# Patient Record
Sex: Female | Born: 1950 | Race: White | Hispanic: No | Marital: Married
Health system: Southern US, Community
[De-identification: ages and names within clinical notes are randomized; demographics above are authoritative.]

---

## 2005-09-01 ENCOUNTER — Ambulatory Visit: Payer: Self-pay | Admitting: Internal Medicine

## 2008-05-08 ENCOUNTER — Ambulatory Visit: Payer: Self-pay | Admitting: Internal Medicine

## 2008-09-14 ENCOUNTER — Ambulatory Visit: Payer: Self-pay | Admitting: General Practice

## 2008-12-15 ENCOUNTER — Ambulatory Visit: Payer: Self-pay

## 2009-08-22 ENCOUNTER — Ambulatory Visit: Payer: Self-pay | Admitting: Internal Medicine

## 2009-09-06 ENCOUNTER — Ambulatory Visit: Payer: Self-pay | Admitting: Gastroenterology

## 2010-06-15 ENCOUNTER — Ambulatory Visit: Payer: Self-pay | Admitting: Internal Medicine

## 2010-11-25 ENCOUNTER — Ambulatory Visit: Payer: Self-pay | Admitting: Internal Medicine

## 2011-11-26 ENCOUNTER — Ambulatory Visit: Payer: Self-pay | Admitting: Internal Medicine

## 2012-04-26 ENCOUNTER — Emergency Department: Payer: Self-pay | Admitting: Internal Medicine

## 2012-04-26 LAB — URINALYSIS, COMPLETE
Bilirubin,UR: NEGATIVE
Glucose,UR: NEGATIVE mg/dL (ref 0–75)
Ketone: NEGATIVE
Ph: 7 (ref 4.5–8.0)
Protein: NEGATIVE
RBC,UR: 2 /HPF (ref 0–5)

## 2012-04-26 LAB — COMPREHENSIVE METABOLIC PANEL
Albumin: 4.2 g/dL (ref 3.4–5.0)
Anion Gap: 7 (ref 7–16)
Bilirubin,Total: 0.4 mg/dL (ref 0.2–1.0)
Chloride: 107 mmol/L (ref 98–107)
Co2: 27 mmol/L (ref 21–32)
Creatinine: 0.78 mg/dL (ref 0.60–1.30)
EGFR (African American): >60
EGFR (Non-African Amer.): >60
Osmolality: 281 (ref 275–301)
Potassium: 3.3 mmol/L — ABNORMAL LOW (ref 3.5–5.1)
SGOT(AST): 29 U/L (ref 15–37)
Sodium: 141 mmol/L (ref 136–145)
Total Protein: 8.6 g/dL — ABNORMAL HIGH (ref 6.4–8.2)

## 2012-04-26 LAB — CBC
HCT: 45.8 % (ref 35.0–47.0)
HGB: 15.3 g/dL (ref 12.0–16.0)
Platelet: 237 10*3/uL (ref 150–440)
RBC: 4.76 10*6/uL (ref 3.80–5.20)
RDW: 13.2 % (ref 11.5–14.5)

## 2012-05-18 ENCOUNTER — Emergency Department: Payer: Self-pay | Admitting: Emergency Medicine

## 2012-05-18 LAB — COMPREHENSIVE METABOLIC PANEL
Alkaline Phosphatase: 65 U/L (ref 50–136)
Anion Gap: 9 (ref 7–16)
BUN: 12 mg/dL (ref 7–18)
Co2: 25 mmol/L (ref 21–32)
Creatinine: 0.68 mg/dL (ref 0.60–1.30)
EGFR (African American): >60
EGFR (Non-African Amer.): >60
Potassium: 3.5 mmol/L (ref 3.5–5.1)
SGOT(AST): 48 U/L — ABNORMAL HIGH (ref 15–37)
SGPT (ALT): 35 U/L (ref 12–78)

## 2012-05-18 LAB — CBC
HCT: 40.1 % (ref 35.0–47.0)
HGB: 13.4 g/dL (ref 12.0–16.0)
MCH: 31.8 pg (ref 26.0–34.0)
MCHC: 33.5 g/dL (ref 32.0–36.0)
MCV: 95 fL (ref 80–100)
Platelet: 294 10*3/uL (ref 150–440)
RDW: 13.1 % (ref 11.5–14.5)

## 2012-05-18 LAB — PROTIME-INR
INR: 0.9
Prothrombin Time: 12.7 secs (ref 11.5–14.7)

## 2012-05-18 LAB — APTT: Activated PTT: 30.9 secs (ref 23.6–35.9)

## 2012-05-20 ENCOUNTER — Ambulatory Visit: Payer: Self-pay | Admitting: Orthopedic Surgery

## 2012-05-23 ENCOUNTER — Ambulatory Visit: Payer: Self-pay | Admitting: General Practice

## 2012-12-05 ENCOUNTER — Ambulatory Visit: Payer: Self-pay | Admitting: Internal Medicine

## 2012-12-06 ENCOUNTER — Ambulatory Visit: Payer: Self-pay | Admitting: Internal Medicine

## 2012-12-17 ENCOUNTER — Inpatient Hospital Stay: Payer: Self-pay | Admitting: Specialist

## 2012-12-17 LAB — BASIC METABOLIC PANEL
Anion Gap: 3 — ABNORMAL LOW (ref 7–16)
BUN: 17 mg/dL (ref 7–18)
Calcium, Total: 9.2 mg/dL (ref 8.5–10.1)
Chloride: 107 mmol/L (ref 98–107)
Co2: 28 mmol/L (ref 21–32)
EGFR (African American): >60
Glucose: 97 mg/dL (ref 65–99)
Potassium: 3.8 mmol/L (ref 3.5–5.1)
Sodium: 138 mmol/L (ref 136–145)

## 2012-12-17 LAB — CBC
HCT: 46.4 % (ref 35.0–47.0)
HGB: 15.9 g/dL (ref 12.0–16.0)
MCH: 32.6 pg (ref 26.0–34.0)
MCHC: 34.4 g/dL (ref 32.0–36.0)
MCV: 95 fL (ref 80–100)
RBC: 4.89 10*6/uL (ref 3.80–5.20)
RDW: 12.5 % (ref 11.5–14.5)
WBC: 12.4 10*3/uL — ABNORMAL HIGH (ref 3.6–11.0)

## 2012-12-17 LAB — PROTIME-INR: INR: 0.9

## 2012-12-17 LAB — HEMOGLOBIN: HGB: 14.2 g/dL (ref 12.0–16.0)

## 2012-12-29 ENCOUNTER — Ambulatory Visit: Payer: Self-pay | Admitting: Internal Medicine

## 2013-01-24 ENCOUNTER — Ambulatory Visit: Payer: Self-pay | Admitting: Specialist

## 2013-02-06 ENCOUNTER — Inpatient Hospital Stay: Payer: Self-pay | Admitting: Internal Medicine

## 2013-02-06 LAB — COMPREHENSIVE METABOLIC PANEL
Albumin: 2.5 g/dL — ABNORMAL LOW (ref 3.4–5.0)
Alkaline Phosphatase: 94 U/L (ref 50–136)
Anion Gap: 9 (ref 7–16)
BUN: 7 mg/dL (ref 7–18)
Bilirubin,Total: 0.4 mg/dL (ref 0.2–1.0)
Calcium, Total: 9.6 mg/dL (ref 8.5–10.1)
Co2: 25 mmol/L (ref 21–32)
Creatinine: 0.67 mg/dL (ref 0.60–1.30)
EGFR (African American): >60
Glucose: 67 mg/dL (ref 65–99)
Potassium: 3.6 mmol/L (ref 3.5–5.1)
SGOT(AST): 16 U/L (ref 15–37)
SGPT (ALT): 22 U/L (ref 12–78)
Sodium: 137 mmol/L (ref 136–145)

## 2013-02-06 LAB — CBC WITH DIFFERENTIAL/PLATELET
Basophil %: 1 %
Eosinophil #: 0.1 10*3/uL (ref 0.0–0.7)
Eosinophil %: 1.2 %
HCT: 39.8 % (ref 35.0–47.0)
HGB: 13.5 g/dL (ref 12.0–16.0)
Lymphocyte #: 2.1 10*3/uL (ref 1.0–3.6)
Lymphocyte %: 21.5 %
MCHC: 34 g/dL (ref 32.0–36.0)
Neutrophil #: 6.4 10*3/uL (ref 1.4–6.5)
Platelet: 385 10*3/uL (ref 150–440)

## 2013-02-06 LAB — URINALYSIS, COMPLETE
Bacteria: NONE SEEN
Bilirubin,UR: NEGATIVE
Glucose,UR: NEGATIVE mg/dL (ref 0–75)
Nitrite: NEGATIVE
Protein: NEGATIVE

## 2013-02-06 LAB — AMYLASE: Amylase: 46 U/L (ref 25–115)

## 2013-02-06 LAB — LIPASE, BLOOD: Lipase: 195 U/L (ref 73–393)

## 2013-02-07 LAB — COMPREHENSIVE METABOLIC PANEL
Albumin: 2.4 g/dL — ABNORMAL LOW (ref 3.4–5.0)
Alkaline Phosphatase: 90 U/L (ref 50–136)
Anion Gap: 10 (ref 7–16)
Chloride: 103 mmol/L (ref 98–107)
Creatinine: 0.63 mg/dL (ref 0.60–1.30)
EGFR (Non-African Amer.): >60
Osmolality: 270 (ref 275–301)
SGPT (ALT): 20 U/L (ref 12–78)
Sodium: 137 mmol/L (ref 136–145)
Total Protein: 7.1 g/dL (ref 6.4–8.2)

## 2013-02-07 LAB — CBC WITH DIFFERENTIAL/PLATELET
Eosinophil #: 0.1 10*3/uL (ref 0.0–0.7)
Eosinophil %: 0.9 %
HCT: 34.6 % — ABNORMAL LOW (ref 35.0–47.0)
HGB: 12.1 g/dL (ref 12.0–16.0)
Lymphocyte %: 14.3 %
MCH: 31.9 pg (ref 26.0–34.0)
MCHC: 34.9 g/dL (ref 32.0–36.0)
Monocyte #: 1 x10 3/mm — ABNORMAL HIGH (ref 0.2–0.9)
Neutrophil #: 6.7 10*3/uL — ABNORMAL HIGH (ref 1.4–6.5)
Neutrophil %: 72.9 %
RBC: 3.78 10*6/uL — ABNORMAL LOW (ref 3.80–5.20)
RDW: 12.5 % (ref 11.5–14.5)
WBC: 9.2 10*3/uL (ref 3.6–11.0)

## 2013-02-11 LAB — COMPREHENSIVE METABOLIC PANEL
Albumin: 2.3 g/dL — ABNORMAL LOW (ref 3.4–5.0)
Alkaline Phosphatase: 65 U/L (ref 50–136)
Anion Gap: 5 — ABNORMAL LOW (ref 7–16)
BUN: <1 mg/dL — ABNORMAL LOW (ref 7–18)
Bilirubin,Total: 0.3 mg/dL (ref 0.2–1.0)
Calcium, Total: 9 mg/dL (ref 8.5–10.1)
Chloride: 105 mmol/L (ref 98–107)
Co2: 28 mmol/L (ref 21–32)
Creatinine: 0.6 mg/dL (ref 0.60–1.30)
EGFR (African American): >60
Sodium: 138 mmol/L (ref 136–145)
Total Protein: 6.4 g/dL (ref 6.4–8.2)

## 2013-02-11 LAB — CBC WITH DIFFERENTIAL/PLATELET
Basophil #: 0.1 10*3/uL (ref 0.0–0.1)
Eosinophil #: 0.3 10*3/uL (ref 0.0–0.7)
Eosinophil %: 3.2 %
HGB: 11.7 g/dL — ABNORMAL LOW (ref 12.0–16.0)
Lymphocyte #: 1.7 10*3/uL (ref 1.0–3.6)
MCH: 31.6 pg (ref 26.0–34.0)
MCHC: 34.5 g/dL (ref 32.0–36.0)
Monocyte #: 1.1 x10 3/mm — ABNORMAL HIGH (ref 0.2–0.9)
Monocyte %: 12 %
Neutrophil #: 5.7 10*3/uL (ref 1.4–6.5)
Neutrophil %: 65 %

## 2013-02-12 LAB — BASIC METABOLIC PANEL
Anion Gap: 7 (ref 7–16)
Co2: 28 mmol/L (ref 21–32)
Creatinine: 0.62 mg/dL (ref 0.60–1.30)
Glucose: 88 mg/dL (ref 65–99)

## 2013-02-13 LAB — COMPREHENSIVE METABOLIC PANEL
Alkaline Phosphatase: 61 U/L (ref 50–136)
Calcium, Total: 8.4 mg/dL — ABNORMAL LOW (ref 8.5–10.1)
Creatinine: 0.62 mg/dL (ref 0.60–1.30)
EGFR (African American): >60
Glucose: 77 mg/dL (ref 65–99)
Osmolality: 278 (ref 275–301)
SGPT (ALT): 9 U/L — ABNORMAL LOW (ref 12–78)
Sodium: 142 mmol/L (ref 136–145)

## 2013-02-13 LAB — CBC WITH DIFFERENTIAL/PLATELET
Basophil #: 0.1 10*3/uL (ref 0.0–0.1)
Basophil %: 1 %
HCT: 32.8 % — ABNORMAL LOW (ref 35.0–47.0)
Lymphocyte %: 21 %
MCH: 31.6 pg (ref 26.0–34.0)
MCV: 93 fL (ref 80–100)
Monocyte #: 1.1 x10 3/mm — ABNORMAL HIGH (ref 0.2–0.9)
Neutrophil #: 6.3 10*3/uL (ref 1.4–6.5)
Neutrophil %: 64.7 %
Platelet: 347 10*3/uL (ref 150–440)
RBC: 3.54 10*6/uL — ABNORMAL LOW (ref 3.80–5.20)
WBC: 9.8 10*3/uL (ref 3.6–11.0)

## 2013-02-22 ENCOUNTER — Emergency Department: Payer: Self-pay | Admitting: Emergency Medicine

## 2013-02-22 LAB — CBC
HGB: 13.8 g/dL (ref 12.0–16.0)
MCH: 31.1 pg (ref 26.0–34.0)
Platelet: 448 10*3/uL — ABNORMAL HIGH (ref 150–440)
RDW: 14.3 % (ref 11.5–14.5)
WBC: 11.9 10*3/uL — ABNORMAL HIGH (ref 3.6–11.0)

## 2013-02-22 LAB — COMPREHENSIVE METABOLIC PANEL
Alkaline Phosphatase: 83 U/L (ref 50–136)
BUN: 5 mg/dL — ABNORMAL LOW (ref 7–18)
Bilirubin,Total: 0.7 mg/dL (ref 0.2–1.0)
Calcium, Total: 9.4 mg/dL (ref 8.5–10.1)
Chloride: 98 mmol/L (ref 98–107)
Co2: 24 mmol/L (ref 21–32)
Creatinine: 0.5 mg/dL — ABNORMAL LOW (ref 0.60–1.30)
Glucose: 110 mg/dL — ABNORMAL HIGH (ref 65–99)
Sodium: 131 mmol/L — ABNORMAL LOW (ref 136–145)
Total Protein: 7.3 g/dL (ref 6.4–8.2)

## 2013-03-03 ENCOUNTER — Inpatient Hospital Stay: Payer: Self-pay | Admitting: Surgery

## 2013-03-03 LAB — CBC WITH DIFFERENTIAL/PLATELET
Basophil %: 1 %
HGB: 13 g/dL (ref 12.0–16.0)
Lymphocyte #: 1.8 10*3/uL (ref 1.0–3.6)
MCH: 30.8 pg (ref 26.0–34.0)
Monocyte #: 1.2 x10 3/mm — ABNORMAL HIGH (ref 0.2–0.9)
Neutrophil %: 72.1 %
Platelet: 372 10*3/uL (ref 150–440)
WBC: 11.3 10*3/uL — ABNORMAL HIGH (ref 3.6–11.0)

## 2013-03-03 LAB — URINALYSIS, COMPLETE
Blood: NEGATIVE
Glucose,UR: NEGATIVE mg/dL (ref 0–75)
Nitrite: NEGATIVE
Ph: 7 (ref 4.5–8.0)
Protein: NEGATIVE
RBC,UR: 1 /HPF (ref 0–5)
Squamous Epithelial: 5
WBC UR: 3 /HPF (ref 0–5)

## 2013-03-03 LAB — COMPREHENSIVE METABOLIC PANEL
Anion Gap: 9 (ref 7–16)
BUN: 5 mg/dL — ABNORMAL LOW (ref 7–18)
Bilirubin,Total: 0.4 mg/dL (ref 0.2–1.0)
Co2: 29 mmol/L (ref 21–32)
Creatinine: 0.58 mg/dL — ABNORMAL LOW (ref 0.60–1.30)
EGFR (African American): >60
EGFR (Non-African Amer.): >60
Glucose: 70 mg/dL (ref 65–99)
Osmolality: 271 (ref 275–301)
Potassium: 3.9 mmol/L (ref 3.5–5.1)
Sodium: 138 mmol/L (ref 136–145)

## 2013-03-03 LAB — CLOSTRIDIUM DIFFICILE BY PCR

## 2013-03-05 LAB — CBC WITH DIFFERENTIAL/PLATELET
Basophil #: 0.1 10*3/uL (ref 0.0–0.1)
Basophil %: 0.5 %
Eosinophil #: 0.1 10*3/uL (ref 0.0–0.7)
Eosinophil %: 1 %
HGB: 12.3 g/dL (ref 12.0–16.0)
Lymphocyte #: 1.8 10*3/uL (ref 1.0–3.6)
Lymphocyte %: 15 %
MCHC: 33.4 g/dL (ref 32.0–36.0)
MCV: 93 fL (ref 80–100)
Neutrophil #: 8.6 10*3/uL — ABNORMAL HIGH (ref 1.4–6.5)
Platelet: 338 10*3/uL (ref 150–440)
WBC: 12 10*3/uL — ABNORMAL HIGH (ref 3.6–11.0)

## 2013-03-05 LAB — COMPREHENSIVE METABOLIC PANEL
Anion Gap: 4 — ABNORMAL LOW (ref 7–16)
BUN: 2 mg/dL — ABNORMAL LOW (ref 7–18)
Bilirubin,Total: 0.4 mg/dL (ref 0.2–1.0)
Calcium, Total: 9 mg/dL (ref 8.5–10.1)
Chloride: 103 mmol/L (ref 98–107)
Creatinine: 0.77 mg/dL (ref 0.60–1.30)
Glucose: 116 mg/dL — ABNORMAL HIGH (ref 65–99)
Potassium: 3.1 mmol/L — ABNORMAL LOW (ref 3.5–5.1)
SGPT (ALT): 15 U/L (ref 12–78)
Sodium: 138 mmol/L (ref 136–145)

## 2013-03-06 LAB — CBC WITH DIFFERENTIAL/PLATELET
Lymphocyte %: 20.2 %
MCH: 31.5 pg (ref 26.0–34.0)
MCHC: 34 g/dL (ref 32.0–36.0)
MCV: 93 fL (ref 80–100)
Monocyte #: 1.2 x10 3/mm — ABNORMAL HIGH (ref 0.2–0.9)
Neutrophil #: 6.5 10*3/uL (ref 1.4–6.5)
Neutrophil %: 65.6 %
Platelet: 328 10*3/uL (ref 150–440)
RBC: 3.76 10*6/uL — ABNORMAL LOW (ref 3.80–5.20)
WBC: 10 10*3/uL (ref 3.6–11.0)

## 2013-03-06 LAB — BASIC METABOLIC PANEL
Anion Gap: 4 — ABNORMAL LOW (ref 7–16)
BUN: 1 mg/dL — ABNORMAL LOW (ref 7–18)
Calcium, Total: 9 mg/dL (ref 8.5–10.1)
Co2: 28 mmol/L (ref 21–32)
EGFR (African American): >60
EGFR (Non-African Amer.): >60
Potassium: 3.6 mmol/L (ref 3.5–5.1)

## 2013-03-07 LAB — CBC WITH DIFFERENTIAL/PLATELET
Basophil #: 0 10*3/uL (ref 0.0–0.1)
HGB: 12 g/dL (ref 12.0–16.0)
Lymphocyte #: 2 10*3/uL (ref 1.0–3.6)
Lymphocyte %: 26.6 %
MCH: 31.1 pg (ref 26.0–34.0)
MCHC: 33.6 g/dL (ref 32.0–36.0)
MCV: 92 fL (ref 80–100)
Monocyte #: 0.8 x10 3/mm (ref 0.2–0.9)
Monocyte %: 11.2 %
Neutrophil #: 4.2 10*3/uL (ref 1.4–6.5)
Platelet: 333 10*3/uL (ref 150–440)
RBC: 3.85 10*6/uL (ref 3.80–5.20)

## 2013-03-07 LAB — COMPREHENSIVE METABOLIC PANEL
Alkaline Phosphatase: 59 U/L (ref 50–136)
Bilirubin,Total: 0.4 mg/dL (ref 0.2–1.0)
Co2: 31 mmol/L (ref 21–32)
Creatinine: 0.69 mg/dL (ref 0.60–1.30)
EGFR (African American): >60
EGFR (Non-African Amer.): >60
Glucose: 92 mg/dL (ref 65–99)
Osmolality: 279 (ref 275–301)
SGOT(AST): 26 U/L (ref 15–37)
SGPT (ALT): 15 U/L (ref 12–78)

## 2013-03-09 LAB — BASIC METABOLIC PANEL
Calcium, Total: 8.7 mg/dL (ref 8.5–10.1)
Creatinine: 0.6 mg/dL (ref 0.60–1.30)
EGFR (African American): >60
EGFR (Non-African Amer.): >60
Glucose: 90 mg/dL (ref 65–99)
Osmolality: 277 (ref 275–301)
Potassium: 3.4 mmol/L — ABNORMAL LOW (ref 3.5–5.1)

## 2013-03-10 LAB — MAGNESIUM: Magnesium: 1.5 mg/dL — ABNORMAL LOW

## 2013-03-13 LAB — CBC WITH DIFFERENTIAL/PLATELET
Basophil #: 0.1 10*3/uL (ref 0.0–0.1)
Eosinophil %: 2.2 %
HCT: 31 % — ABNORMAL LOW (ref 35.0–47.0)
MCH: 31 pg (ref 26.0–34.0)
MCHC: 33.6 g/dL (ref 32.0–36.0)
MCV: 92 fL (ref 80–100)
Neutrophil #: 5.6 10*3/uL (ref 1.4–6.5)
Platelet: 277 10*3/uL (ref 150–440)
RBC: 3.36 10*6/uL — ABNORMAL LOW (ref 3.80–5.20)
RDW: 14.8 % — ABNORMAL HIGH (ref 11.5–14.5)
WBC: 9.8 10*3/uL (ref 3.6–11.0)

## 2013-03-13 LAB — COMPREHENSIVE METABOLIC PANEL
Albumin: 1.7 g/dL — ABNORMAL LOW (ref 3.4–5.0)
Anion Gap: 3 — ABNORMAL LOW (ref 7–16)
Bilirubin,Total: 0.3 mg/dL (ref 0.2–1.0)
Calcium, Total: 8.3 mg/dL — ABNORMAL LOW (ref 8.5–10.1)
Chloride: 107 mmol/L (ref 98–107)
Co2: 30 mmol/L (ref 21–32)
EGFR (African American): >60
EGFR (Non-African Amer.): >60
Glucose: 228 mg/dL — ABNORMAL HIGH (ref 65–99)
Osmolality: 285 (ref 275–301)
Potassium: 3.1 mmol/L — ABNORMAL LOW (ref 3.5–5.1)
SGOT(AST): 18 U/L (ref 15–37)
Sodium: 140 mmol/L (ref 136–145)
Total Protein: 6.2 g/dL — ABNORMAL LOW (ref 6.4–8.2)

## 2013-03-14 LAB — CBC WITH DIFFERENTIAL/PLATELET
Basophil #: 0.1 10*3/uL (ref 0.0–0.1)
Basophil %: 1.1 %
Eosinophil %: 4.5 %
Lymphocyte %: 22.6 %
MCHC: 33.9 g/dL (ref 32.0–36.0)
MCV: 92 fL (ref 80–100)
RBC: 3.48 10*6/uL — ABNORMAL LOW (ref 3.80–5.20)
WBC: 8.4 10*3/uL (ref 3.6–11.0)

## 2013-03-14 LAB — COMPREHENSIVE METABOLIC PANEL
Albumin: 1.7 g/dL — ABNORMAL LOW (ref 3.4–5.0)
Alkaline Phosphatase: 71 U/L (ref 50–136)
Anion Gap: 5 — ABNORMAL LOW (ref 7–16)
Bilirubin,Total: 0.3 mg/dL (ref 0.2–1.0)
Calcium, Total: 8.5 mg/dL (ref 8.5–10.1)
Chloride: 106 mmol/L (ref 98–107)
Co2: 28 mmol/L (ref 21–32)
Creatinine: 0.6 mg/dL (ref 0.60–1.30)
EGFR (African American): >60
EGFR (Non-African Amer.): >60
Osmolality: 285 (ref 275–301)
Potassium: 3.5 mmol/L (ref 3.5–5.1)

## 2013-03-15 LAB — CBC WITH DIFFERENTIAL/PLATELET
Basophil %: 0.5 %
Eosinophil %: 0.6 %
HGB: 10.7 g/dL — ABNORMAL LOW (ref 12.0–16.0)
Lymphocyte #: 2 10*3/uL (ref 1.0–3.6)
Lymphocyte %: 13.2 %
MCH: 30.3 pg (ref 26.0–34.0)
MCHC: 32.6 g/dL (ref 32.0–36.0)
MCV: 93 fL (ref 80–100)
Monocyte %: 10.1 %
Neutrophil %: 75.6 %
Platelet: 312 10*3/uL (ref 150–440)
RBC: 3.53 10*6/uL — ABNORMAL LOW (ref 3.80–5.20)
RDW: 14.6 % — ABNORMAL HIGH (ref 11.5–14.5)
WBC: 15.3 10*3/uL — ABNORMAL HIGH (ref 3.6–11.0)

## 2013-03-15 LAB — BASIC METABOLIC PANEL
BUN: 11 mg/dL (ref 7–18)
Chloride: 103 mmol/L (ref 98–107)
Co2: 30 mmol/L (ref 21–32)
Creatinine: 0.6 mg/dL (ref 0.60–1.30)
EGFR (Non-African Amer.): >60

## 2013-03-16 LAB — COMPREHENSIVE METABOLIC PANEL
Albumin: 1.7 g/dL — ABNORMAL LOW (ref 3.4–5.0)
BUN: 8 mg/dL (ref 7–18)
Bilirubin,Total: 0.4 mg/dL (ref 0.2–1.0)
Calcium, Total: 8.8 mg/dL (ref 8.5–10.1)
Chloride: 97 mmol/L — ABNORMAL LOW (ref 98–107)
Creatinine: 0.68 mg/dL (ref 0.60–1.30)
EGFR (Non-African Amer.): >60
Glucose: 250 mg/dL — ABNORMAL HIGH (ref 65–99)
SGOT(AST): 13 U/L — ABNORMAL LOW (ref 15–37)
SGPT (ALT): 15 U/L (ref 12–78)
Total Protein: 7.3 g/dL (ref 6.4–8.2)

## 2013-03-16 LAB — CBC WITH DIFFERENTIAL/PLATELET
Basophil #: 0.2 10*3/uL — ABNORMAL HIGH (ref 0.0–0.1)
Basophil %: 0.8 %
Eosinophil #: 0.2 10*3/uL (ref 0.0–0.7)
HCT: 34.1 % — ABNORMAL LOW (ref 35.0–47.0)
HGB: 11 g/dL — ABNORMAL LOW (ref 12.0–16.0)
Monocyte #: 2.1 x10 3/mm — ABNORMAL HIGH (ref 0.2–0.9)
Monocyte %: 10 %
RDW: 14.9 % — ABNORMAL HIGH (ref 11.5–14.5)
WBC: 20.9 10*3/uL — ABNORMAL HIGH (ref 3.6–11.0)

## 2013-03-16 LAB — PHOSPHORUS: Phosphorus: 2.1 mg/dL — ABNORMAL LOW (ref 2.5–4.9)

## 2013-03-17 LAB — CBC WITH DIFFERENTIAL/PLATELET
Eosinophil #: 0.3 10*3/uL (ref 0.0–0.7)
Eosinophil %: 2.1 %
HGB: 9.8 g/dL — ABNORMAL LOW (ref 12.0–16.0)
Lymphocyte %: 9.1 %
MCH: 30.3 pg (ref 26.0–34.0)
MCHC: 32.7 g/dL (ref 32.0–36.0)
MCV: 93 fL (ref 80–100)
Monocyte #: 1.6 x10 3/mm — ABNORMAL HIGH (ref 0.2–0.9)
Platelet: 340 10*3/uL (ref 150–440)
RBC: 3.22 10*6/uL — ABNORMAL LOW (ref 3.80–5.20)
RDW: 15.1 % — ABNORMAL HIGH (ref 11.5–14.5)

## 2013-03-17 LAB — COMPREHENSIVE METABOLIC PANEL
Albumin: 1.4 g/dL — ABNORMAL LOW (ref 3.4–5.0)
BUN: 10 mg/dL (ref 7–18)
Calcium, Total: 8 mg/dL — ABNORMAL LOW (ref 8.5–10.1)
Co2: 32 mmol/L (ref 21–32)
Creatinine: 0.53 mg/dL — ABNORMAL LOW (ref 0.60–1.30)
EGFR (African American): >60
EGFR (Non-African Amer.): >60
Glucose: 230 mg/dL — ABNORMAL HIGH (ref 65–99)
SGOT(AST): 17 U/L (ref 15–37)
SGPT (ALT): 11 U/L — ABNORMAL LOW (ref 12–78)
Total Protein: 5.9 g/dL — ABNORMAL LOW (ref 6.4–8.2)

## 2013-03-17 LAB — PHOSPHORUS: Phosphorus: 2.5 mg/dL (ref 2.5–4.9)

## 2013-03-17 LAB — MAGNESIUM: Magnesium: 1.8 mg/dL

## 2013-03-18 LAB — SODIUM: Sodium: 136 mmol/L (ref 136–145)

## 2013-03-18 LAB — CBC WITH DIFFERENTIAL/PLATELET
Eosinophil #: 0.4 10*3/uL (ref 0.0–0.7)
Eosinophil %: 2.9 %
Lymphocyte #: 1.9 10*3/uL (ref 1.0–3.6)
Lymphocyte %: 12.9 %
MCHC: 32.8 g/dL (ref 32.0–36.0)
Monocyte #: 1.5 x10 3/mm — ABNORMAL HIGH (ref 0.2–0.9)
Monocyte %: 10 %
Neutrophil #: 10.9 10*3/uL — ABNORMAL HIGH (ref 1.4–6.5)
Platelet: 390 10*3/uL (ref 150–440)
RDW: 15.2 % — ABNORMAL HIGH (ref 11.5–14.5)
WBC: 14.9 10*3/uL — ABNORMAL HIGH (ref 3.6–11.0)

## 2013-03-18 LAB — PHOSPHORUS: Phosphorus: 2.3 mg/dL — ABNORMAL LOW (ref 2.5–4.9)

## 2013-03-18 LAB — FOLATE: Folic Acid: 3.8 ng/mL (ref 3.1–100.0)

## 2013-03-18 LAB — POTASSIUM: Potassium: 3.5 mmol/L (ref 3.5–5.1)

## 2013-03-18 LAB — MAGNESIUM: Magnesium: 1.6 mg/dL — ABNORMAL LOW

## 2013-03-19 LAB — COMPREHENSIVE METABOLIC PANEL
Albumin: 1.8 g/dL — ABNORMAL LOW (ref 3.4–5.0)
Alkaline Phosphatase: 107 U/L (ref 50–136)
Anion Gap: 3 — ABNORMAL LOW (ref 7–16)
BUN: 7 mg/dL (ref 7–18)
Calcium, Total: 8.8 mg/dL (ref 8.5–10.1)
Creatinine: 0.58 mg/dL — ABNORMAL LOW (ref 0.60–1.30)
EGFR (Non-African Amer.): >60
Glucose: 116 mg/dL — ABNORMAL HIGH (ref 65–99)
Osmolality: 275 (ref 275–301)
Potassium: 3.6 mmol/L (ref 3.5–5.1)
SGOT(AST): 33 U/L (ref 15–37)
SGPT (ALT): 20 U/L (ref 12–78)
Total Protein: 6.5 g/dL (ref 6.4–8.2)

## 2013-03-19 LAB — CBC WITH DIFFERENTIAL/PLATELET
Basophil %: 0.9 %
Eosinophil #: 0.6 10*3/uL (ref 0.0–0.7)
HCT: 29.6 % — ABNORMAL LOW (ref 35.0–47.0)
HGB: 9.7 g/dL — ABNORMAL LOW (ref 12.0–16.0)
MCH: 30.6 pg (ref 26.0–34.0)
MCV: 93 fL (ref 80–100)
Monocyte #: 1.5 x10 3/mm — ABNORMAL HIGH (ref 0.2–0.9)
Platelet: 383 10*3/uL (ref 150–440)
RBC: 3.18 10*6/uL — ABNORMAL LOW (ref 3.80–5.20)
RDW: 15.8 % — ABNORMAL HIGH (ref 11.5–14.5)

## 2013-03-19 LAB — PHOSPHORUS: Phosphorus: 3.5 mg/dL (ref 2.5–4.9)

## 2013-03-19 LAB — MAGNESIUM: Magnesium: 1.6 mg/dL — ABNORMAL LOW

## 2013-03-20 LAB — SODIUM: Sodium: 138 mmol/L (ref 136–145)

## 2013-03-20 LAB — CALCIUM: Calcium, Total: 8.4 mg/dL — ABNORMAL LOW (ref 8.5–10.1)

## 2013-03-20 LAB — POTASSIUM: Potassium: 3.8 mmol/L (ref 3.5–5.1)

## 2013-03-20 LAB — MAGNESIUM: Magnesium: 2 mg/dL

## 2013-03-20 LAB — CLOSTRIDIUM DIFFICILE(ARMC)

## 2013-03-21 LAB — CBC WITH DIFFERENTIAL/PLATELET
Basophil %: 0.6 %
Eosinophil %: 3.3 %
HCT: 32.3 % — ABNORMAL LOW (ref 35.0–47.0)
HGB: 10.5 g/dL — ABNORMAL LOW (ref 12.0–16.0)
Lymphocyte #: 1.2 10*3/uL (ref 1.0–3.6)
Lymphocyte %: 9.1 %
MCH: 30.6 pg (ref 26.0–34.0)
MCHC: 32.4 g/dL (ref 32.0–36.0)
MCV: 94 fL (ref 80–100)
Monocyte #: 1 x10 3/mm — ABNORMAL HIGH (ref 0.2–0.9)
Monocyte %: 7.8 %
Neutrophil #: 10.1 10*3/uL — ABNORMAL HIGH (ref 1.4–6.5)
Platelet: 388 10*3/uL (ref 150–440)
RDW: 16.5 % — ABNORMAL HIGH (ref 11.5–14.5)
WBC: 12.8 10*3/uL — ABNORMAL HIGH (ref 3.6–11.0)

## 2013-03-21 LAB — CALCIUM: Calcium, Total: 7.9 mg/dL — ABNORMAL LOW (ref 8.5–10.1)

## 2013-03-21 LAB — MAGNESIUM: Magnesium: 1.7 mg/dL — ABNORMAL LOW

## 2013-03-21 LAB — PHOSPHORUS: Phosphorus: 3.7 mg/dL (ref 2.5–4.9)

## 2013-03-22 LAB — CBC WITH DIFFERENTIAL/PLATELET
Basophil #: 0 10*3/uL (ref 0.0–0.1)
Basophil %: 0.3 %
Eosinophil #: 0.4 10*3/uL (ref 0.0–0.7)
HGB: 10.2 g/dL — ABNORMAL LOW (ref 12.0–16.0)
Lymphocyte #: 2.2 10*3/uL (ref 1.0–3.6)
MCH: 30.3 pg (ref 26.0–34.0)
MCHC: 32.3 g/dL (ref 32.0–36.0)
Monocyte #: 1.3 x10 3/mm — ABNORMAL HIGH (ref 0.2–0.9)
Neutrophil %: 68.5 %
RBC: 3.36 10*6/uL — ABNORMAL LOW (ref 3.80–5.20)
WBC: 12.3 10*3/uL — ABNORMAL HIGH (ref 3.6–11.0)

## 2013-03-22 LAB — PHOSPHORUS: Phosphorus: 2.4 mg/dL — ABNORMAL LOW (ref 2.5–4.9)

## 2013-03-22 LAB — SODIUM: Sodium: 138 mmol/L (ref 136–145)

## 2013-03-23 LAB — PHOSPHORUS: Phosphorus: 3.2 mg/dL (ref 2.5–4.9)

## 2013-03-23 LAB — SODIUM: Sodium: 138 mmol/L (ref 136–145)

## 2013-03-24 LAB — MAGNESIUM: Magnesium: 1.6 mg/dL — ABNORMAL LOW

## 2013-03-24 LAB — CBC WITH DIFFERENTIAL/PLATELET
Basophil #: 0.1 10*3/uL (ref 0.0–0.1)
Basophil %: 1.3 %
Eosinophil #: 0.4 10*3/uL (ref 0.0–0.7)
Eosinophil %: 3.9 %
HCT: 32 % — ABNORMAL LOW (ref 35.0–47.0)
HGB: 10.4 g/dL — ABNORMAL LOW (ref 12.0–16.0)
Lymphocyte #: 2.2 10*3/uL (ref 1.0–3.6)
Lymphocyte %: 19.3 %
MCV: 94 fL (ref 80–100)
Monocyte %: 12.6 %
Neutrophil #: 7 10*3/uL — ABNORMAL HIGH (ref 1.4–6.5)
Neutrophil %: 62.9 %
Platelet: 423 10*3/uL (ref 150–440)

## 2013-03-24 LAB — POTASSIUM: Potassium: 3.8 mmol/L (ref 3.5–5.1)

## 2013-03-24 LAB — PROTIME-INR: Prothrombin Time: 13.6 secs (ref 11.5–14.7)

## 2013-03-24 LAB — PHOSPHORUS: Phosphorus: 3 mg/dL (ref 2.5–4.9)

## 2013-03-24 LAB — SODIUM: Sodium: 137 mmol/L (ref 136–145)

## 2013-03-24 LAB — CALCIUM: Calcium, Total: 8.6 mg/dL (ref 8.5–10.1)

## 2013-03-25 LAB — BASIC METABOLIC PANEL
BUN: 11 mg/dL (ref 7–18)
Calcium, Total: 8.3 mg/dL — ABNORMAL LOW (ref 8.5–10.1)
Chloride: 108 mmol/L — ABNORMAL HIGH (ref 98–107)
Creatinine: 0.58 mg/dL — ABNORMAL LOW (ref 0.60–1.30)
EGFR (African American): >60
EGFR (Non-African Amer.): >60
Glucose: 119 mg/dL — ABNORMAL HIGH (ref 65–99)
Osmolality: 272 (ref 275–301)
Potassium: 4.2 mmol/L (ref 3.5–5.1)
Sodium: 136 mmol/L (ref 136–145)

## 2013-03-26 LAB — BASIC METABOLIC PANEL
Anion Gap: 3 — ABNORMAL LOW (ref 7–16)
Anion Gap: 9 (ref 7–16)
BUN: 10 mg/dL (ref 7–18)
BUN: 11 mg/dL (ref 7–18)
Calcium, Total: 8.1 mg/dL — ABNORMAL LOW (ref 8.5–10.1)
Co2: 25 mmol/L (ref 21–32)
Co2: 25 mmol/L (ref 21–32)
Creatinine: 0.63 mg/dL (ref 0.60–1.30)
EGFR (African American): >60
EGFR (Non-African Amer.): >60
Glucose: 125 mg/dL — ABNORMAL HIGH (ref 65–99)
Osmolality: 298 (ref 275–301)
Potassium: 4 mmol/L (ref 3.5–5.1)
Sodium: 139 mmol/L (ref 136–145)

## 2013-03-26 LAB — MAGNESIUM: Magnesium: 2 mg/dL

## 2013-03-27 LAB — BASIC METABOLIC PANEL
Anion Gap: 8 (ref 7–16)
BUN: 11 mg/dL (ref 7–18)
Calcium, Total: 8.3 mg/dL — ABNORMAL LOW (ref 8.5–10.1)
Creatinine: 0.45 mg/dL — ABNORMAL LOW (ref 0.60–1.30)
EGFR (African American): >60
Glucose: 112 mg/dL — ABNORMAL HIGH (ref 65–99)
Osmolality: 272 (ref 275–301)
Sodium: 136 mmol/L (ref 136–145)

## 2013-03-28 LAB — PLATELET COUNT: Platelet: 407 10*3/uL (ref 150–440)

## 2013-03-28 LAB — ALBUMIN: Albumin: 1.7 g/dL — ABNORMAL LOW (ref 3.4–5.0)

## 2013-03-28 LAB — CALCIUM: Calcium, Total: 7.9 mg/dL — ABNORMAL LOW (ref 8.5–10.1)

## 2013-03-28 LAB — POTASSIUM: Potassium: 3.9 mmol/L (ref 3.5–5.1)

## 2013-03-29 LAB — COMPREHENSIVE METABOLIC PANEL
BUN: 9 mg/dL (ref 7–18)
Bilirubin,Total: 0.4 mg/dL (ref 0.2–1.0)
Calcium, Total: 8 mg/dL — ABNORMAL LOW (ref 8.5–10.1)
Chloride: 100 mmol/L (ref 98–107)
Co2: 29 mmol/L (ref 21–32)
Creatinine: 0.53 mg/dL — ABNORMAL LOW (ref 0.60–1.30)
EGFR (Non-African Amer.): >60
Glucose: 91 mg/dL (ref 65–99)
Potassium: 3.8 mmol/L (ref 3.5–5.1)
SGOT(AST): 10 U/L — ABNORMAL LOW (ref 15–37)
Sodium: 134 mmol/L — ABNORMAL LOW (ref 136–145)
Total Protein: 5.4 g/dL — ABNORMAL LOW (ref 6.4–8.2)

## 2013-03-29 LAB — CBC WITH DIFFERENTIAL/PLATELET
Bands: 2 %
HCT: 30.1 % — ABNORMAL LOW (ref 35.0–47.0)
Lymphocytes: 21 %
MCH: 30.4 pg (ref 26.0–34.0)
MCHC: 32.5 g/dL (ref 32.0–36.0)
MCV: 94 fL (ref 80–100)
Monocytes: 12 %
Platelet: 377 10*3/uL (ref 150–440)
RBC: 3.22 10*6/uL — ABNORMAL LOW (ref 3.80–5.20)
Variant Lymphocyte - H1-Rlymph: 3 %
WBC: 14.2 10*3/uL — ABNORMAL HIGH (ref 3.6–11.0)

## 2013-03-30 LAB — CBC WITH DIFFERENTIAL/PLATELET
Basophil #: 0.1 10*3/uL (ref 0.0–0.1)
HCT: 29.2 % — ABNORMAL LOW (ref 35.0–47.0)
Lymphocyte %: 20.9 %
MCHC: 33.2 g/dL (ref 32.0–36.0)
Monocyte #: 1.7 x10 3/mm — ABNORMAL HIGH (ref 0.2–0.9)
Monocyte %: 16.4 %
Neutrophil %: 55.9 %
RDW: 16.7 % — ABNORMAL HIGH (ref 11.5–14.5)
WBC: 10.5 10*3/uL (ref 3.6–11.0)

## 2013-03-30 LAB — SODIUM: Sodium: 137 mmol/L (ref 136–145)

## 2013-03-30 LAB — POTASSIUM: Potassium: 3.7 mmol/L (ref 3.5–5.1)

## 2013-03-30 LAB — PHOSPHORUS: Phosphorus: 3.5 mg/dL (ref 2.5–4.9)

## 2013-03-30 LAB — CALCIUM: Calcium, Total: 8.5 mg/dL (ref 8.5–10.1)

## 2013-03-31 LAB — BASIC METABOLIC PANEL
BUN: 8 mg/dL (ref 7–18)
Calcium, Total: 8.5 mg/dL (ref 8.5–10.1)
Chloride: 105 mmol/L (ref 98–107)
Co2: 29 mmol/L (ref 21–32)
Creatinine: 0.55 mg/dL — ABNORMAL LOW (ref 0.60–1.30)
EGFR (African American): >60
Osmolality: 275 (ref 275–301)
Potassium: 3.7 mmol/L (ref 3.5–5.1)

## 2013-03-31 LAB — PHOSPHORUS: Phosphorus: 3.5 mg/dL (ref 2.5–4.9)

## 2013-03-31 LAB — MAGNESIUM: Magnesium: 1.5 mg/dL — ABNORMAL LOW

## 2013-04-01 LAB — BASIC METABOLIC PANEL
Anion Gap: 4 — ABNORMAL LOW (ref 7–16)
BUN: 9 mg/dL (ref 7–18)
Calcium, Total: 8.6 mg/dL (ref 8.5–10.1)
Chloride: 104 mmol/L (ref 98–107)
Creatinine: 0.59 mg/dL — ABNORMAL LOW (ref 0.60–1.30)
EGFR (African American): >60
Osmolality: 274 (ref 275–301)
Sodium: 137 mmol/L (ref 136–145)

## 2013-04-01 LAB — MAGNESIUM: Magnesium: 1.8 mg/dL

## 2013-04-01 LAB — PHOSPHORUS: Phosphorus: 3.7 mg/dL (ref 2.5–4.9)

## 2013-04-02 LAB — POTASSIUM: Potassium: 3.6 mmol/L (ref 3.5–5.1)

## 2013-04-03 LAB — SODIUM: Sodium: 138 mmol/L (ref 136–145)

## 2013-04-03 LAB — CALCIUM: Calcium, Total: 8.8 mg/dL (ref 8.5–10.1)

## 2013-04-03 LAB — MAGNESIUM: Magnesium: 1.7 mg/dL — ABNORMAL LOW

## 2013-04-04 LAB — COMPREHENSIVE METABOLIC PANEL
Albumin: 1.9 g/dL — ABNORMAL LOW (ref 3.4–5.0)
BUN: 8 mg/dL (ref 7–18)
Bilirubin,Total: 0.2 mg/dL (ref 0.2–1.0)
Co2: 27 mmol/L (ref 21–32)
Creatinine: 0.52 mg/dL — ABNORMAL LOW (ref 0.60–1.30)
EGFR (African American): >60
EGFR (Non-African Amer.): >60
Glucose: 148 mg/dL — ABNORMAL HIGH (ref 65–99)
Potassium: 3.6 mmol/L (ref 3.5–5.1)
SGOT(AST): 12 U/L — ABNORMAL LOW (ref 15–37)
Total Protein: 6.2 g/dL — ABNORMAL LOW (ref 6.4–8.2)

## 2013-04-04 LAB — CBC WITH DIFFERENTIAL/PLATELET
Basophil %: 1 %
Eosinophil #: 0.2 10*3/uL (ref 0.0–0.7)
Eosinophil %: 1.8 %
HCT: 30.1 % — ABNORMAL LOW (ref 35.0–47.0)
HGB: 9.9 g/dL — ABNORMAL LOW (ref 12.0–16.0)
Lymphocyte %: 13.4 %
MCH: 30.6 pg (ref 26.0–34.0)
MCHC: 32.9 g/dL (ref 32.0–36.0)
MCV: 93 fL (ref 80–100)
Monocyte #: 1.1 x10 3/mm — ABNORMAL HIGH (ref 0.2–0.9)
Monocyte %: 8.8 %
Neutrophil #: 9.5 10*3/uL — ABNORMAL HIGH (ref 1.4–6.5)
Platelet: 343 10*3/uL (ref 150–440)
WBC: 12.7 10*3/uL — ABNORMAL HIGH (ref 3.6–11.0)

## 2013-04-05 LAB — SODIUM: Sodium: 138 mmol/L (ref 136–145)

## 2013-04-05 LAB — MAGNESIUM: Magnesium: 1.9 mg/dL

## 2013-04-05 LAB — POTASSIUM: Potassium: 3.7 mmol/L (ref 3.5–5.1)

## 2013-04-06 LAB — CBC WITH DIFFERENTIAL/PLATELET
Basophil #: 0.1 10*3/uL (ref 0.0–0.1)
Eosinophil #: 0.3 10*3/uL (ref 0.0–0.7)
Eosinophil %: 2.8 %
HCT: 31.5 % — ABNORMAL LOW (ref 35.0–47.0)
HGB: 10.3 g/dL — ABNORMAL LOW (ref 12.0–16.0)
MCH: 30.5 pg (ref 26.0–34.0)
MCV: 93 fL (ref 80–100)
Monocyte #: 1.2 x10 3/mm — ABNORMAL HIGH (ref 0.2–0.9)
Monocyte %: 12.5 %
Neutrophil #: 6.2 10*3/uL (ref 1.4–6.5)
Neutrophil %: 66.2 %
Platelet: 397 10*3/uL (ref 150–440)
RBC: 3.39 10*6/uL — ABNORMAL LOW (ref 3.80–5.20)
RDW: 16.7 % — ABNORMAL HIGH (ref 11.5–14.5)
WBC: 9.3 10*3/uL (ref 3.6–11.0)

## 2013-04-06 LAB — CALCIUM: Calcium, Total: 8.9 mg/dL (ref 8.5–10.1)

## 2013-04-06 LAB — SODIUM: Sodium: 138 mmol/L (ref 136–145)

## 2013-04-06 LAB — ALBUMIN: Albumin: 2 g/dL — ABNORMAL LOW (ref 3.4–5.0)

## 2013-04-07 LAB — CALCIUM: Calcium, Total: 8.8 mg/dL (ref 8.5–10.1)

## 2013-04-07 LAB — CREATININE, SERUM
Creatinine: 0.56 mg/dL — ABNORMAL LOW (ref 0.60–1.30)
EGFR (Non-African Amer.): >60

## 2013-04-07 LAB — POTASSIUM: Potassium: 3.6 mmol/L (ref 3.5–5.1)

## 2013-04-07 LAB — ALBUMIN: Albumin: 2 g/dL — ABNORMAL LOW (ref 3.4–5.0)

## 2013-04-07 LAB — PHOSPHORUS: Phosphorus: 3.4 mg/dL (ref 2.5–4.9)

## 2013-04-07 LAB — MAGNESIUM: Magnesium: 2.2 mg/dL

## 2013-04-08 LAB — POTASSIUM: Potassium: 3.8 mmol/L (ref 3.5–5.1)

## 2013-04-08 LAB — SODIUM: Sodium: 137 mmol/L (ref 136–145)

## 2013-04-08 LAB — CALCIUM: Calcium, Total: 8.8 mg/dL (ref 8.5–10.1)

## 2013-04-08 LAB — ALBUMIN: Albumin: 2.1 g/dL — ABNORMAL LOW (ref 3.4–5.0)

## 2013-04-09 LAB — SODIUM: Sodium: 140 mmol/L (ref 136–145)

## 2013-04-09 LAB — CALCIUM: Calcium, Total: 9 mg/dL (ref 8.5–10.1)

## 2013-04-09 LAB — POTASSIUM: Potassium: 3.5 mmol/L (ref 3.5–5.1)

## 2013-04-10 LAB — COMPREHENSIVE METABOLIC PANEL
Alkaline Phosphatase: 78 U/L
BUN: 11 mg/dL (ref 7–18)
Bilirubin,Total: 0.2 mg/dL (ref 0.2–1.0)
Calcium, Total: 8.9 mg/dL (ref 8.5–10.1)
EGFR (African American): >60
Osmolality: 279 (ref 275–301)
SGPT (ALT): 23 U/L (ref 12–78)
Total Protein: 6.6 g/dL (ref 6.4–8.2)

## 2013-04-10 LAB — CBC WITH DIFFERENTIAL/PLATELET
Basophil %: 0.9 %
Eosinophil #: 0.3 10*3/uL (ref 0.0–0.7)
Eosinophil %: 3.7 %
HGB: 10.2 g/dL — ABNORMAL LOW (ref 12.0–16.0)
Lymphocyte #: 2.1 10*3/uL (ref 1.0–3.6)
Lymphocyte %: 24.5 %
Monocyte #: 1.1 x10 3/mm — ABNORMAL HIGH (ref 0.2–0.9)
Monocyte %: 13 %
Neutrophil #: 5.1 10*3/uL (ref 1.4–6.5)
Neutrophil %: 57.9 %
RBC: 3.28 10*6/uL — ABNORMAL LOW (ref 3.80–5.20)
RDW: 16.8 % — ABNORMAL HIGH (ref 11.5–14.5)

## 2013-04-11 LAB — POTASSIUM: Potassium: 3.8 mmol/L (ref 3.5–5.1)

## 2013-04-12 LAB — CBC WITH DIFFERENTIAL/PLATELET
Basophil #: 0.1 10*3/uL (ref 0.0–0.1)
Eosinophil #: 0 10*3/uL (ref 0.0–0.7)
Eosinophil %: 0.1 %
Lymphocyte #: 1.1 10*3/uL (ref 1.0–3.6)
MCHC: 32.6 g/dL (ref 32.0–36.0)
Monocyte #: 1.2 x10 3/mm — ABNORMAL HIGH (ref 0.2–0.9)
Monocyte %: 7.2 %
Neutrophil #: 14.5 10*3/uL — ABNORMAL HIGH (ref 1.4–6.5)
Neutrophil %: 85.9 %

## 2013-04-13 LAB — SODIUM: Sodium: 134 mmol/L — ABNORMAL LOW (ref 136–145)

## 2013-04-13 LAB — POTASSIUM: Potassium: 3.9 mmol/L (ref 3.5–5.1)

## 2013-04-13 LAB — PHOSPHORUS: Phosphorus: 2.7 mg/dL (ref 2.5–4.9)

## 2013-04-13 LAB — CALCIUM: Calcium, Total: 8.8 mg/dL (ref 8.5–10.1)

## 2013-04-14 LAB — CBC WITH DIFFERENTIAL/PLATELET
Basophil #: 0.1 10*3/uL (ref 0.0–0.1)
Eosinophil #: 0.6 10*3/uL (ref 0.0–0.7)
Eosinophil %: 4.2 %
MCH: 29.1 pg (ref 26.0–34.0)
MCHC: 31.5 g/dL — ABNORMAL LOW (ref 32.0–36.0)
Monocyte #: 1.9 x10 3/mm — ABNORMAL HIGH (ref 0.2–0.9)
Monocyte %: 13.1 %
Neutrophil #: 9 10*3/uL — ABNORMAL HIGH (ref 1.4–6.5)
Neutrophil %: 62 %
Platelet: 427 10*3/uL (ref 150–440)
RDW: 16.4 % — ABNORMAL HIGH (ref 11.5–14.5)
WBC: 14.5 10*3/uL — ABNORMAL HIGH (ref 3.6–11.0)

## 2013-04-14 LAB — WOUND CULTURE

## 2013-04-14 LAB — CALCIUM: Calcium, Total: 8.6 mg/dL (ref 8.5–10.1)

## 2013-04-14 LAB — POTASSIUM: Potassium: 4.2 mmol/L (ref 3.5–5.1)

## 2013-04-14 LAB — PHOSPHORUS: Phosphorus: 2.9 mg/dL (ref 2.5–4.9)

## 2013-04-15 LAB — SODIUM: Sodium: 136 mmol/L (ref 136–145)

## 2013-04-15 LAB — CALCIUM: Calcium, Total: 8.4 mg/dL — ABNORMAL LOW (ref 8.5–10.1)

## 2013-04-15 LAB — MAGNESIUM: Magnesium: 1.9 mg/dL

## 2013-04-15 LAB — PHOSPHORUS: Phosphorus: 3.8 mg/dL (ref 2.5–4.9)

## 2013-04-16 LAB — CALCIUM: Calcium, Total: 8.7 mg/dL (ref 8.5–10.1)

## 2013-04-17 LAB — PHOSPHORUS: Phosphorus: 3.4 mg/dL (ref 2.5–4.9)

## 2013-04-17 LAB — MAGNESIUM: Magnesium: 2 mg/dL

## 2013-04-18 LAB — CBC WITH DIFFERENTIAL/PLATELET
Basophil #: 0.1 10*3/uL (ref 0.0–0.1)
Eosinophil #: 0.4 10*3/uL (ref 0.0–0.7)
Eosinophil %: 3.7 %
HCT: 29.4 % — ABNORMAL LOW (ref 35.0–47.0)
HGB: 9.6 g/dL — ABNORMAL LOW (ref 12.0–16.0)
Lymphocyte #: 2.4 10*3/uL (ref 1.0–3.6)
Lymphocyte %: 24.3 %
MCH: 29.6 pg (ref 26.0–34.0)
MCHC: 32.6 g/dL (ref 32.0–36.0)
Monocyte %: 14.5 %
Neutrophil #: 5.6 10*3/uL (ref 1.4–6.5)
RBC: 3.23 10*6/uL — ABNORMAL LOW (ref 3.80–5.20)
WBC: 9.9 10*3/uL (ref 3.6–11.0)

## 2013-04-18 LAB — MAGNESIUM: Magnesium: 2.2 mg/dL

## 2013-04-18 LAB — ALBUMIN: Albumin: 1.9 g/dL — ABNORMAL LOW (ref 3.4–5.0)

## 2013-04-18 LAB — CALCIUM: Calcium, Total: 9 mg/dL (ref 8.5–10.1)

## 2013-04-18 LAB — POTASSIUM: Potassium: 3.9 mmol/L (ref 3.5–5.1)

## 2013-04-20 LAB — CBC WITH DIFFERENTIAL/PLATELET
Eosinophil %: 3.6 %
HCT: 29.8 % — ABNORMAL LOW (ref 35.0–47.0)
HGB: 9.4 g/dL — ABNORMAL LOW (ref 12.0–16.0)
Lymphocyte #: 2.3 10*3/uL (ref 1.0–3.6)
Monocyte #: 1.5 x10 3/mm — ABNORMAL HIGH (ref 0.2–0.9)
Monocyte %: 14.9 %
Neutrophil %: 57.5 %
RBC: 3.28 10*6/uL — ABNORMAL LOW (ref 3.80–5.20)
RDW: 16.3 % — ABNORMAL HIGH (ref 11.5–14.5)
WBC: 10.2 10*3/uL (ref 3.6–11.0)

## 2013-04-20 LAB — BASIC METABOLIC PANEL
Anion Gap: 4 — ABNORMAL LOW (ref 7–16)
BUN: 9 mg/dL (ref 7–18)
Chloride: 105 mmol/L (ref 98–107)
EGFR (Non-African Amer.): >60
Glucose: 90 mg/dL (ref 65–99)
Osmolality: 276 (ref 275–301)
Sodium: 139 mmol/L (ref 136–145)

## 2013-04-21 LAB — MAGNESIUM: Magnesium: 2.1 mg/dL

## 2013-04-21 LAB — ALBUMIN: Albumin: 2 g/dL — ABNORMAL LOW (ref 3.4–5.0)

## 2013-04-21 LAB — CALCIUM: Calcium, Total: 9.1 mg/dL (ref 8.5–10.1)

## 2013-04-22 LAB — MAGNESIUM: Magnesium: 2.1 mg/dL

## 2013-04-22 LAB — ALBUMIN: Albumin: 2.2 g/dL — ABNORMAL LOW (ref 3.4–5.0)

## 2013-04-22 LAB — PHOSPHORUS: Phosphorus: 4.1 mg/dL (ref 2.5–4.9)

## 2013-04-22 LAB — CALCIUM: Calcium, Total: 9.2 mg/dL (ref 8.5–10.1)

## 2013-04-22 LAB — SODIUM: Sodium: 137 mmol/L (ref 136–145)

## 2013-04-23 LAB — MAGNESIUM: Magnesium: 1.6 mg/dL — ABNORMAL LOW

## 2013-04-23 LAB — BASIC METABOLIC PANEL
Calcium, Total: 9.4 mg/dL (ref 8.5–10.1)
Chloride: 105 mmol/L (ref 98–107)
Co2: 28 mmol/L (ref 21–32)
EGFR (African American): >60
EGFR (Non-African Amer.): >60
Potassium: 3.7 mmol/L (ref 3.5–5.1)
Sodium: 137 mmol/L (ref 136–145)

## 2013-04-23 LAB — PHOSPHORUS: Phosphorus: 4.2 mg/dL (ref 2.5–4.9)

## 2013-04-26 ENCOUNTER — Emergency Department: Payer: Self-pay | Admitting: Emergency Medicine

## 2013-04-26 LAB — COMPREHENSIVE METABOLIC PANEL
Albumin: 2.9 g/dL — ABNORMAL LOW (ref 3.4–5.0)
Alkaline Phosphatase: 92 U/L
BUN: 10 mg/dL (ref 7–18)
Bilirubin,Total: 0.7 mg/dL (ref 0.2–1.0)
Chloride: 102 mmol/L (ref 98–107)
EGFR (Non-African Amer.): >60
Glucose: 117 mg/dL — ABNORMAL HIGH (ref 65–99)
Osmolality: 268 (ref 275–301)
Potassium: 3.3 mmol/L — ABNORMAL LOW (ref 3.5–5.1)
SGOT(AST): 29 U/L (ref 15–37)
SGPT (ALT): 31 U/L (ref 12–78)
Sodium: 134 mmol/L — ABNORMAL LOW (ref 136–145)
Total Protein: 8.3 g/dL — ABNORMAL HIGH (ref 6.4–8.2)

## 2013-04-26 LAB — CBC WITH DIFFERENTIAL/PLATELET
Basophil #: 0.1 10*3/uL (ref 0.0–0.1)
Eosinophil #: 0.1 10*3/uL (ref 0.0–0.7)
Eosinophil %: 0.4 %
Lymphocyte #: 2 10*3/uL (ref 1.0–3.6)
Lymphocyte %: 14.5 %
MCHC: 32.9 g/dL (ref 32.0–36.0)
MCV: 89 fL (ref 80–100)
Monocyte #: 1.4 x10 3/mm — ABNORMAL HIGH (ref 0.2–0.9)
Neutrophil #: 10.1 10*3/uL — ABNORMAL HIGH (ref 1.4–6.5)
Neutrophil %: 74.1 %
Platelet: 528 10*3/uL — ABNORMAL HIGH (ref 150–440)
RBC: 4.19 10*6/uL (ref 3.80–5.20)
RDW: 16.6 % — ABNORMAL HIGH (ref 11.5–14.5)
WBC: 13.7 10*3/uL — ABNORMAL HIGH (ref 3.6–11.0)

## 2013-06-19 ENCOUNTER — Ambulatory Visit: Payer: Self-pay | Admitting: Internal Medicine

## 2013-07-07 ENCOUNTER — Inpatient Hospital Stay: Payer: Self-pay | Admitting: Internal Medicine

## 2013-07-07 LAB — CBC WITH DIFFERENTIAL/PLATELET
HCT: 45.7 % (ref 35.0–47.0)
HGB: 15.2 g/dL (ref 12.0–16.0)
Lymphocytes: 23 %
MCH: 29.6 pg (ref 26.0–34.0)
MCHC: 33.3 g/dL (ref 32.0–36.0)
MCV: 89 fL (ref 80–100)
Monocytes: 8 %
PLATELETS: 421 10*3/uL (ref 150–440)
RBC: 5.15 10*6/uL (ref 3.80–5.20)
RDW: 17.6 % — ABNORMAL HIGH (ref 11.5–14.5)
SEGMENTED NEUTROPHILS: 69 %
WBC: 18.3 10*3/uL — AB (ref 3.6–11.0)

## 2013-07-07 LAB — CLOSTRIDIUM DIFFICILE(ARMC)

## 2013-07-07 LAB — COMPREHENSIVE METABOLIC PANEL
ALK PHOS: 100 U/L
ALT: 12 U/L (ref 12–78)
ANION GAP: 10 (ref 7–16)
AST: 20 U/L (ref 15–37)
Albumin: 2.5 g/dL — ABNORMAL LOW (ref 3.4–5.0)
BUN: 7 mg/dL (ref 7–18)
Bilirubin,Total: 0.7 mg/dL (ref 0.2–1.0)
CALCIUM: 9.4 mg/dL (ref 8.5–10.1)
CHLORIDE: 99 mmol/L (ref 98–107)
CO2: 25 mmol/L (ref 21–32)
CREATININE: 0.61 mg/dL (ref 0.60–1.30)
EGFR (Non-African Amer.): >60
Glucose: 80 mg/dL (ref 65–99)
Osmolality: 265 (ref 275–301)
POTASSIUM: 3.5 mmol/L (ref 3.5–5.1)
Sodium: 134 mmol/L — ABNORMAL LOW (ref 136–145)
TOTAL PROTEIN: 8.3 g/dL — AB (ref 6.4–8.2)

## 2013-07-07 LAB — URINALYSIS, COMPLETE
Bacteria: NONE SEEN
Bilirubin,UR: NEGATIVE
GLUCOSE, UR: NEGATIVE mg/dL (ref 0–75)
NITRITE: NEGATIVE
PH: 6 (ref 4.5–8.0)
PROTEIN: NEGATIVE
RBC,UR: 1 /HPF (ref 0–5)
SPECIFIC GRAVITY: 1.005 (ref 1.003–1.030)
WBC UR: 84 /HPF (ref 0–5)

## 2013-07-07 LAB — LIPASE, BLOOD: LIPASE: 120 U/L (ref 73–393)

## 2013-07-08 LAB — CBC WITH DIFFERENTIAL/PLATELET
Basophil #: 0.1 10*3/uL (ref 0.0–0.1)
Basophil %: 0.7 %
Eosinophil #: 0.1 10*3/uL (ref 0.0–0.7)
Eosinophil %: 1.1 %
HCT: 34.2 % — ABNORMAL LOW (ref 35.0–47.0)
HGB: 11.2 g/dL — AB (ref 12.0–16.0)
LYMPHS PCT: 22.8 %
Lymphocyte #: 2.4 10*3/uL (ref 1.0–3.6)
MCH: 28.9 pg (ref 26.0–34.0)
MCHC: 32.7 g/dL (ref 32.0–36.0)
MCV: 89 fL (ref 80–100)
MONO ABS: 1.1 x10 3/mm — AB (ref 0.2–0.9)
Monocyte %: 10.6 %
NEUTROS PCT: 64.8 %
Neutrophil #: 6.9 10*3/uL — ABNORMAL HIGH (ref 1.4–6.5)
PLATELETS: 369 10*3/uL (ref 150–440)
RBC: 3.87 10*6/uL (ref 3.80–5.20)
RDW: 17.3 % — AB (ref 11.5–14.5)
WBC: 10.6 10*3/uL (ref 3.6–11.0)

## 2013-07-08 LAB — MAGNESIUM: Magnesium: 1.7 mg/dL — ABNORMAL LOW

## 2013-07-08 LAB — COMPREHENSIVE METABOLIC PANEL
ALBUMIN: 1.9 g/dL — AB (ref 3.4–5.0)
ALK PHOS: 71 U/L
Anion Gap: 9 (ref 7–16)
BUN: 4 mg/dL — ABNORMAL LOW (ref 7–18)
Bilirubin,Total: 0.6 mg/dL (ref 0.2–1.0)
CHLORIDE: 104 mmol/L (ref 98–107)
CO2: 26 mmol/L (ref 21–32)
Calcium, Total: 8.3 mg/dL — ABNORMAL LOW (ref 8.5–10.1)
Creatinine: 0.67 mg/dL (ref 0.60–1.30)
Glucose: 79 mg/dL (ref 65–99)
Osmolality: 273 (ref 275–301)
Potassium: 2.5 mmol/L — CL (ref 3.5–5.1)
SGOT(AST): 13 U/L — ABNORMAL LOW (ref 15–37)
SGPT (ALT): 8 U/L — ABNORMAL LOW (ref 12–78)
Sodium: 139 mmol/L (ref 136–145)
Total Protein: 6.5 g/dL (ref 6.4–8.2)

## 2013-07-09 LAB — CBC WITH DIFFERENTIAL/PLATELET
BASOS ABS: 0.1 10*3/uL (ref 0.0–0.1)
Basophil %: 0.9 %
EOS ABS: 0.2 10*3/uL (ref 0.0–0.7)
Eosinophil %: 2.2 %
HCT: 33.7 % — ABNORMAL LOW (ref 35.0–47.0)
HGB: 11 g/dL — ABNORMAL LOW (ref 12.0–16.0)
LYMPHS ABS: 2.7 10*3/uL (ref 1.0–3.6)
Lymphocyte %: 36.9 %
MCH: 29 pg (ref 26.0–34.0)
MCHC: 32.6 g/dL (ref 32.0–36.0)
MCV: 89 fL (ref 80–100)
MONO ABS: 0.9 x10 3/mm (ref 0.2–0.9)
MONOS PCT: 11.9 %
NEUTROS PCT: 48.1 %
Neutrophil #: 3.5 10*3/uL (ref 1.4–6.5)
Platelet: 321 10*3/uL (ref 150–440)
RBC: 3.79 10*6/uL — ABNORMAL LOW (ref 3.80–5.20)
RDW: 17.2 % — AB (ref 11.5–14.5)
WBC: 7.4 10*3/uL (ref 3.6–11.0)

## 2013-07-09 LAB — BASIC METABOLIC PANEL
ANION GAP: 4 — AB (ref 7–16)
BUN: 1 mg/dL — AB (ref 7–18)
CALCIUM: 8.3 mg/dL — AB (ref 8.5–10.1)
CHLORIDE: 111 mmol/L — AB (ref 98–107)
CO2: 25 mmol/L (ref 21–32)
CREATININE: 0.66 mg/dL (ref 0.60–1.30)
GLUCOSE: 80 mg/dL (ref 65–99)
Osmolality: 274 (ref 275–301)
Potassium: 3.1 mmol/L — ABNORMAL LOW (ref 3.5–5.1)
SODIUM: 140 mmol/L (ref 136–145)

## 2013-07-09 LAB — URINE CULTURE

## 2013-07-10 LAB — BASIC METABOLIC PANEL
ANION GAP: 4 — AB (ref 7–16)
CHLORIDE: 111 mmol/L — AB (ref 98–107)
Calcium, Total: 8.7 mg/dL (ref 8.5–10.1)
Co2: 28 mmol/L (ref 21–32)
Creatinine: 0.65 mg/dL (ref 0.60–1.30)
EGFR (African American): >60
EGFR (Non-African Amer.): >60
Glucose: 85 mg/dL (ref 65–99)
Potassium: 3.5 mmol/L (ref 3.5–5.1)
Sodium: 143 mmol/L (ref 136–145)

## 2013-07-11 LAB — BASIC METABOLIC PANEL
Anion Gap: 6 — ABNORMAL LOW (ref 7–16)
CO2: 27 mmol/L (ref 21–32)
CREATININE: 0.68 mg/dL (ref 0.60–1.30)
Calcium, Total: 8.6 mg/dL (ref 8.5–10.1)
Chloride: 109 mmol/L — ABNORMAL HIGH (ref 98–107)
EGFR (African American): >60
EGFR (Non-African Amer.): >60
Glucose: 80 mg/dL (ref 65–99)
Potassium: 3.5 mmol/L (ref 3.5–5.1)
Sodium: 142 mmol/L (ref 136–145)

## 2013-07-11 LAB — MAGNESIUM: MAGNESIUM: 1.9 mg/dL
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
## 2014-08-24 NOTE — H&P (Signed)
PATIENT NAME:  [name], [name] MR#:  [id number] DATE OF BIRTH:  07-Sep-1950  DATE OF ADMISSION:  04/26/2013  HISTORY OF PRESENT ILLNESS: This 64 year old female comes in with chief complaint of vomiting. She was taken a diet satisfactorily yesterday and did have stool draining from her colostomy yesterday, but has not seen any stool coming out the colostomy today, but is passing some gas. She had several episodes of vomiting this morning. We had her come to the Emergency Room for evaluation.   PAST MEDICAL HISTORY: Has significantly had recent bout of severe diverticulitis of the sigmoid colon with abscess formation and failure to respond to antibiotic treatment necessitating an emergency sigmoid colectomy with [facility]. Her postoperative course was slow and did have further signs of distal small bowel obstruction. She had a second laparotomy with lysis of adhesions with findings of marked inflammation of the distal small bowel and she failed to have significant improvement and was returned to the operating room at which time she had an entero right transverse colostomy and with time has developed bowel function and had been on TPN for many weeks, but returned to oral intake and was discharged from the hospital 2 days ago.   MEDICATIONS: Include acetaminophen.   PHYSICAL EXAMINATION:  GENERAL: She is awake, alert and oriented.  VITAL SIGNS: Temperature is 98.1, pulse 103, respirations 20, blood pressure 126/60 and oxygen saturating 95%.  ABDOMEN: Soft, mildly obese and minimal left lower quadrant tenderness. A well-healed long mid abdominal incision. The colostomy bag was removed and examined the colostomy. The mucosa is typical color and a finger was introduced. No findings of impaction. There was a small amount of pasty stool found at the orifice.   CLINICAL DATA: Glucose 117, potassium slightly low at 3.3, albumin is improved from recent studies at 2.9. White blood count 13,700, hemoglobin  12.3, platelet count 528,000. It is further noted that the patient had a chest x-ray, which I have reviewed. I also reviewed the radiologist's report. I did see there is gas in the transverse colon and normal gastric folds. There is no evidence of obstruction on the x-ray.   She did receive 25 mg of intramuscular Phenergan on arrival and her nausea is now resolved and feeling better.   IMPRESSION: Vomiting, which is likely related to marked intra-abdominal inflammation subsequent to recent bout of diverticulitis with abscess.   PLAN: To have her on sips and clear liquids and if that stays down, will discharge her back to home.   I have written a prescription for Zofran 4 mg dispense 10, take 1 p.o. q.4 hours p.r.n. for nausea, refill x 1  if needed.   I also wrote her a prescription for Restoril 15 mg dispense 30. refill x 1 if needed to take as needed for sleep as she has had much difficulty sleeping.   Plan is to gradually advance diet and follow up in the office on January 2.   ____________________________ Shela CommonsJ. Renda RollsWilton Smith, MD jws:aw D: 04/26/2013 12:30:56 ET T: 04/26/2013 12:44:25 ET JOB#: [id number]  cc: Adella HareJ. Wilton Smith, MD, <Dictator> Adella HareWILTON J SMITH MD ELECTRONICALLY SIGNED 04/26/2013 17:55

## 2014-08-24 NOTE — Consult Note (Signed)
Brief Consult Note: Diagnosis: cdiff, lower abdominal pain, abnormal CT scan.   Patient was seen by consultant.   Consult note dictated.   Discussed with Attending MD.   Comments: Patient seen and examined, case discussed with Dr Shelle Ironein. Cdiff has been confirmed and pt is on contact precautions. Currently on IV Flagyl and Zosyn. CT scan was reviewed, severe inflammatory changes noted in the TI and sigmoid colon with possible fistula vs abscess formation. She has a hx of diverticulosis and therefore this could certainly be a degree of diverticulitis ontop of a cdiff infection, but of course IBD/Crohns is on the differential as well. Last colonoscopy per reports was 2005, no mention of diverticulosis.  Therefore, continue abx, contact precautions, CL diet. Will recommend repeat colonoscopy 6-8 weeks down the line once the acute episode has settled in order to evaluate and narrow the differential.  Certainly pending her clinical response to the current tx regimen, we may also consider a SBFT. Will follow closely. full consult dictated.  Electronic Signatures: Ashok Cordiaarle, Luiz Trumpower M (PA-C)  (Signed 07-Oct-14 15:55)  Authored: Brief Consult Note   Last Updated: 07-Oct-14 15:55 by Ashok CordiaEarle, Sheryn Aldaz M (PA-C)

## 2014-08-24 NOTE — Consult Note (Signed)
Pt CC contained abcess, diverticulitis.  Pt walked around nurse station today.  Wearing compression devices.  K 3.1 WBC 8.7, hgb 10.9, Mg 1.3, supplement ordered.  VSS afebrile.  On iv Zosyn, TPN.  No new suggestions.  Electronic Signatures: Scot JunElliott, Robert T (MD)  (Signed on 05-Nov-14 17:41)  Authored  Last Updated: 05-Nov-14 17:41 by Scot JunElliott, Robert T (MD)

## 2014-08-24 NOTE — H&P (Signed)
Subjective/Chief Complaint Pain left ankle   History of Present Illness 64 year old female fell on step outside her home this AM injuring the left ankle.  Brought to Emergency Room where exam and X-rays show a displaced trimalleolar fracture of the left ankle.  X-rays right ankle normal.  Initially foot was blue but circulation improved following closed reduction by Dr; Karma Greaser.  Small abrasion over medial ankle but fracture is not compound.  Advised open reduction and internal fixation  of fracture to patient and husband who agree after discussing options.  Risks and benefits of surgery were discussed at length including but not limited to infection, non union, nerve or blood vessed damage, non union, need for repeat surgery, blood clots and lung emboli, and death. Plan surgery later this afternoon as she drank metamucil at 8 AM.   Primary Physician Emily Filbert   Past Med/Surgical Hx:  Diverticulitis:   ALLERGIES:  No Known Allergies:   HOME MEDICATIONS: Medication Instructions Status  Norco 5 mg-325 mg oral tablet 1 tab(s) orally 1-2 Q 4 PRN Active  Vitamin D3 50,000 intl units oral capsule 1 cap(s) orally once a week Active  aspirin 81 mg oral tablet 1 tab(s) orally once a day Active  Fish Oil oral capsule 1 cap(s) orally once a day Active   Family and Social History:  Family History Non-Contributory   Social History negative tobacco   Place of Living Home   Review of Systems:  Fever/Chills No   Cough No   Sputum No   Abdominal Pain No   Physical Exam:  GEN well developed, well nourished, no acute distress   HEENT pink conjunctivae   NECK supple   RESP normal resp effort   CARD regular rate   ABD denies tenderness   LYMPH negative neck   EXTR negative cyanosis/clubbing, Alert and comfortable.  circulation/sensation/motor function good distally with very pink toes.  Small abrasion dorsomedially not in way of incisions.  fracture moves with manipulation and is  unstable.  Right ankle mildly sore without any swelling.   SKIN abrasion   NEURO motor/sensory function intact   PSYCH alert, A+O to time, place, person, good insight   Lab Results:  Routine Chem:  16-Aug-14 10:52   Glucose, Serum 97  BUN 17  Creatinine (comp) 0.77  Sodium, Serum 138  Potassium, Serum 3.8  Chloride, Serum 107  CO2, Serum 28  Calcium (Total), Serum 9.2  Anion Gap  3  Osmolality (calc) 277  eGFR (African American) >60  eGFR (Non-African American) >60 (eGFR values <87m/min/1.73 m2 may be an indication of chronic kidney disease (CKD). Calculated eGFR is useful in patients with stable renal function. The eGFR calculation will not be reliable in acutely ill patients when serum creatinine is changing rapidly. It is not useful in  patients on dialysis. The eGFR calculation may not be applicable to patients at the low and high extremes of body sizes, pregnant women, and vegetarians.)  Routine Hem:  16-Aug-14 10:52   WBC (CBC)  12.4  RBC (CBC) 4.89  Hemoglobin (CBC) 15.9  Hematocrit (CBC) 46.4  Platelet Count (CBC) 249 (Result(s) reported on 17 Dec 2012 at 10:42AM.)  MCV 95  MCH 32.6  MCHC 34.4  RDW 12.5   Radiology Results: XRay:    16-Aug-14 10:10, Ankle Left Complete  Ankle Left Complete  REASON FOR EXAM:    fall, displaced break, reduced in ED  COMMENTS:   May transport without cardiac monitor    PROCEDURE:  DXR - DXR ANKLE LEFT COMPLETE  - Dec 17 2012 10:10AM     RESULT: There fractures in the distal left fibula and medial malleolus of   the left tibia with lateral displacement by one half shaft width with   comminuted fragments present. The talus appears to be intact. The   posterior malleolus does not show definite fracture.    IMPRESSION:  Bimalleolar fracture of the left ankle. Post reduction   images are recommended to better evaluate the posterior malleolus.    Dictation Site: 6    Verified By: Sundra Aland, M.D., MD    16-Aug-14  10:10, Ankle Right Complete  Ankle Right Complete  REASON FOR EXAM:    fall, lateral pain/tenderness  COMMENTS:   May transport without cardiac monitor    PROCEDURE: DXR - DXR ANKLE RIGHT COMPLETE  - Dec 17 2012 10:10AM     RESULT: Soft tissue swelling is present laterally. There is a small   avulsedfracture adjacent to the medial malleolus which may be chronic.   Minimal curvilinear avulsion from the distal fibula is not excluded. No   other fracture is evident. Degenerative spurring is seen in the Achilles   insertion on the calcaneus.    IMPRESSION:   1. Please see above.    Dictation Site: 6    Verified By: Sundra Aland, M.D., MD  LabUnknown:    16-Aug-14 10:10, Ankle Left Complete  PACS Image    16-Aug-14 10:10, Ankle Right Complete  PACS Image    Assessment/Admission Diagnosis Unstable left trimalleolar ankle fracture dislocation srain right ankle   Plan open reduction and internal fixation left ankle later today.   Electronic Signatures: Park Breed (MD)  (Signed 16-Aug-14 12:16)  Authored: CHIEF COMPLAINT and HISTORY, PAST MEDICAL/SURGIAL HISTORY, ALLERGIES, HOME MEDICATIONS, FAMILY AND SOCIAL HISTORY, REVIEW OF SYSTEMS, PHYSICAL EXAM, LABS, Radiology, ASSESSMENT AND PLAN   Last Updated: 16-Aug-14 12:16 by Park Breed (MD)

## 2014-08-24 NOTE — Consult Note (Signed)
Pt VSS runs a bit low in BP.  she tried to take in clear liquids and was nauseated and almost vomited.  EGD a few days ago did not show any ulcers or signif inflammation.  If she cannot eat or drink without signif nausea it could be medicine induced or due to her disease process in her abd.  Dr. Bluford Kaufmannh will be on the weekend and see her for me.  Electronic Signatures: Scot JunElliott, Ammi Hutt T (MD)  (Signed on 07-Nov-14 17:53)  Authored  Last Updated: 07-Nov-14 17:53 by Scot JunElliott, Raedyn Wenke T (MD)

## 2014-08-24 NOTE — Op Note (Signed)
PATIENT NAME:  [name], [name]#:  [id number] DATE OF BIRTH:  1950-06-14  DATE OF PROCEDURE:  03/03/2013  PREOPERATIVE DIAGNOSIS: Small intestinal obstruction.   POSTOPERATIVE DIAGNOSIS: Small intestinal obstruction, intra-abdominal abscess.   PROCEDURE: Laparotomy, lysis of adhesions and drainage of abdominal abscess.   SURGEON: Adella HareJ. Wilton Tania Perrott, M.D.   ANESTHESIA: General.   INDICATION: This 64 year old female has had recent diverticulitis and sigmoid colon resection for diverticulitis with multiple abscesses. She has had postoperative findings of small bowel obstruction, and surgery was recommended for definitive treatment.   DESCRIPTION OF PROCEDURE: The patient was placed on the operating table in the supine position under general anesthesia. The circulating nurse inserted a Foley urinary catheter with Betadine preparation of the perineum, draining a clear yellow urine. The colostomy bag was removed. The abdomen was prepared with ChloraPrep. The skin around the ostomy was prepared with Betadine. The skin between the ostomy and the midline was treated with benzoin and OpSite barrier was applied with 1 inch adhesive band to separate the midline from the colostomy. Next, the abdomen was draped in a sterile manner.   An incision was made in the old scar which was carried down through subcutaneous tissues. There was a small hematoma which was evacuated. The Maxon sutures were removed from the deep fascia, and the fascia was separated and the peritoneal cavity was entered. There was a trace of ascites. There were multiple adhesions found. There dilated loops of small bowel. There were multiple dense adhesions found in the pelvis. There were dense adhesions between the small bowel and the stump of her distal bowel. The right ovary appeared normal. There was an abscess encountered which a swab was sent for culture. This abscess appeared to be small in size and was adjacent to the distal bowel stump, and  this pus was aspirated. Next, additional lysis of adhesions was carried out. There were 2 small serosal tears, each of which were repaired with 4-0 Vicryl simple sutures. The terminal ileum contained some dense adhesions, and a fold in the terminal ileum was straightened by lysis of dense adhesions. There were 2 other sites of partial obstruction which were lysed. Next, I could see that dilated proximal bowel was comparatively free of adhesions. It could be followed proximally and subsequently milked intestinal contents through the previously obstructed terminal ileum and could demonstrate that the fluid would flow into the large bowel. As noted, a small amount of blood was aspirated during the procedure. A number of small bleeding points were cauterized. The site was irrigated with saline solution and aspirated. Hemostasis subsequently appeared to be intact. The bowel was returned to the abdominal cavity. The fascial closure was carried out with interrupted 0 Maxon figure-of-eight sutures. A Penrose drain was inserted into the wound and brought out anteriorly between staples as the wound was closed with staples, and also the Penrose drain was held in place using two 1/4 inch Penrose drains and 3-0 nylon suture. Dressings were applied. The skin surrounding the stoma was treated with benzoin and a colostomy bag applied, and dressings were held intact with paper tape. The patient had good urine output during the procedure, and Foley catheter was subsequently removed.   Gram stain demonstrated gram-positive rods, gram-positive cocci and gram-negative rods, and decision was made to place the patient on postoperative Zosyn. The patient was prepared for transfer to the recovery room.    ____________________________ Shela CommonsJ. Renda RollsWilton Deneen Slager, MD jws:gb D: 03/27/2013 16:19:23 ET T: 03/28/2013 00:36:57 ET JOB#: [id number]  cc: Adella Hare, MD, <Dictator> Adella Hare MD ELECTRONICALLY SIGNED 03/28/2013 18:50

## 2014-08-24 NOTE — Discharge Summary (Signed)
PATIENT NAME:  [name], [name] MR#:  [id number] DATE OF BIRTH:  1951/04/10  DATE OF ADMISSION:  12/17/2012 DATE OF DISCHARGE:  12/20/2012   FINAL DIAGNOSES:  1. Displaced unstable trimalleolar fracture-dislocation, left ankle.  2. Severe sprain of the right ankle.  3. History of diverticulitis.   OPERATIONS: On 12/17/2012, open reduction and internal fixation, left ankle fracture.  COMPLICATIONS: None.   CONSULTATIONS: None.   DISCHARGE MEDICATIONS:  1. Neurontin 400 mg Luna.i.d.  2. Enteric-coated aspirin 1 p.o. Luna.i.d. while in a cast.  3. Mobic 7.5 mg daily.  4. Norco 5/325 q.6 h. p.r.n. pain. 5. Continue home medications, vitamin D3, fish oil.   HISTORY OF PRESENT ILLNESS: The patient is a 64 year old female who fell on a step in her front yard on Saturday, 12/17/2012, injuring both ankles. She was brought to the Emergency Room, where exam and x-rays revealed an unstable left trimalleolar ankle fracture-dislocation. Foot apparently had bluish discoloration in the Emergency Room and was reduced by the Emergency Room physician, with improvement in color. X-rays of the right ankle did not show any fractures, but she did have pain and swelling. No other significant injuries were noted.   PAST MEDICAL HISTORY:   ILLNESSES: As above.   MEDICATIONS: As above.   ALLERGIES: None.   OPERATIONS: None.  REVIEW OF SYSTEMS: Unremarkable.   FAMILY HISTORY: Unremarkable.   PHYSICAL EXAMINATION ON ADMISSION: VITAL SIGNS: Normal.  EXTREMITIES: The left ankle was unstable and tender with mild swelling. There was a small abrasion anteromedially. Her skin was intact. The fracture had not come through the skin. Sensation was good. The right ankle showed some swelling and tenderness anterolaterally. This was stable.   LABORATORY DATA ON ADMISSION: Satisfactory.   HOSPITAL COURSE: The patient's left ankle was splinted temporarily, and she was taken to surgery later the day of injury. She underwent  open reduction and internal fixation and did well postoperatively. Dressings were changed on the second postoperative day and a short leg cast applied. The wounds were benign. The patient was placed in an ankle-foot orthosis on the right ankle to support this along with an Ace bandage. She participated with physical therapy, and she and her husband felt that she could go home on 12/20/2012. She will ice and elevate leg. Use a wheelchair and a walker. She will remain nonweightbearing on the left. Will get home health PT. Will see her back in the office in 10 days for exam and x-ray.  ____________________________ Valinda HoarHoward E. Navid Lenzen, MD hem:OSi D: 12/20/2012 10:37:27 ET T: 12/20/2012 13:28:38 ET JOB#: [id number]  cc: Valinda HoarHoward E. Gayle Collard, MD, <Dictator> Danella PentonMark F. Diar Berkel, MD Valinda HoarHOWARD E Brandilyn Nanninga MD ELECTRONICALLY SIGNED 12/22/2012 7:34

## 2014-08-24 NOTE — Op Note (Signed)
PATIENT NAME:  [name], [name] MR#:  [id number] DATE OF BIRTH:  [birth year]  DATE OF PROCEDURE:  03/07/2013  PREOPERATIVE DIAGNOSES: 1.  Clostridium difficile colitis.  2.  Malnutrition.  3.  Dehydration.   POSTOPERATIVE DIAGNOSES:   1.  Clostridium difficile colitis.  2.  Malnutrition.  3.  Dehydration.   PROCEDURES:  1. Ultrasound guidance for vascular access to left brachial vein.  2. Fluoroscopic guidance for placement of catheter.  3. Insertion of peripherally inserted central venous catheter, triple lumen, left arm.  SURGEON: Festus BarrenJason Dalila Arca, M.D.  ANESTHESIA: Local.   ESTIMATED BLOOD LOSS: Minimal.   INDICATION FOR PROCEDURE: A 64 year old white female with C. diff colitis and multiple other issues.  We are asked to placed a PICC line for durable IV therapy.  DESCRIPTION OF PROCEDURE: The patient's left arm was sterilely prepped and draped, and a sterile surgical field was created. The left brachial vein was accessed under direct ultrasound guidance without difficulty with a micropuncture needle and permanent image was recorded. 0.018 wire was then placed into the superior vena cava. Peel-away sheath was placed over the wire. A single lumen peripherally inserted central venous catheter was then placed over the wire and the wire and peel-away sheath were removed. The catheter tip was placed into the superior vena cava and was secured at the skin at 44 cm with a sterile dressing. The catheter withdrew blood well and flushed easily with heparinized saline. The patient tolerated procedure well.    ____________________________ Lindsey NeedyJason S. Fizza Scales, MD jsd:dmm D: 03/13/2013 17:08:00 ET T: 03/13/2013 20:22:29 ET JOB#: [id number]  cc: Lindsey NeedyJason S. Jakira Mcfadden, MD, <Dictator> Lindsey NeedyJASON S Racheal Mathurin MD ELECTRONICALLY SIGNED 03/16/2013 14:47

## 2014-08-24 NOTE — Consult Note (Signed)
Patient well covered by Internist and surgeon.  I will sign off, reconsult if needed.  Electronic Signatures: Scot JunElliott, Dagen Beevers T (MD)  (Signed on 14-Nov-14 17:24)  Authored  Last Updated: 14-Nov-14 17:24 by Scot JunElliott, Shareta Fishbaugh T (MD)

## 2014-08-24 NOTE — Consult Note (Signed)
Plan to get CT of abd and pelvis with contrast tomorrow but will talk to patient and family in morning before ordering it.  Plan to do EGD and colonoscopy Monday, likely will need surgery next week.  Electronic Signatures: Scot JunElliott, Bibiana Gillean T (MD)  (Signed on 31-Oct-14 22:15)  Authored  Last Updated: 31-Oct-14 22:15 by Scot JunElliott, Kyleigha Markert T (MD)

## 2014-08-24 NOTE — H&P (Signed)
PATIENT NAME:  [name], [name] MR#:  [id number] DATE OF BIRTH:  1951-03-02  DATE OF ADMISSION:  02/06/2013  HISTORY OF PRESENT ILLNESS:  A 64 year old female presents with severe lower quadrant abdominal pain with intractable nausea, vomiting, dehydration. She has recently had a trimalleolar fracture of her left leg and was seen Friday with left lower quadrant abdominal pain,  treated with Augmentin. Over the weekend, her abdominal pain became very severe. She went to the ED but the line was so long she which went back home. She has not eaten anything today. She is very lightheaded, when she stands up. If she eats anything, she has profound nausea. She has had fever, chills. She has been moving her bowels, but just in diarrhea. She says this is the worst she has ever felt. She just says she cannot last another night at home. She has been taking anti-inflammatories and narcotics for her fracture. She stopped all that over the weekend.    PAST MEDICAL HISTORY AND MEDICAL ILLNESSES:  1.  Transient ischemic attack.  2.  History of vitamin D deficiency.  3.  Diverticulosis.  4.  Postmenopausal state.  5.  Surgical D and C.  6.  Cholecystectomy.  7.  BTL.  8.  Left humerus fracture.   SOCIAL HISTORY: Married.   FAMILY HISTORY: Noncontributory.   ALLERGIES:  No known drug allergies.   MEDICATIONS:  Zofran ODT p.r.n., Tylenol extra-strength p.r.n., gabapentin 400 mg b.i.d., aspirin 324 mg daily, Fosamax 70 mg weekly, vitamin D2 50,000 units weekly.    REVIEW OF SYSTEMS: Otherwise negative.   PHYSICAL EXAMINATION: VITAL SIGNS: Blood pressure 102/70, pulse 76 systolic pressure drops to 85 on standing.  HEENT: Benign.  NECK: No thyromegaly. No JVD.  LUNGS: Clear.  HEART: Regular rhythm.  ABDOMEN: No bowel sounds. Soft, severe left lower quadrant lower quadrant tenderness with questionable rebound.  EXTREMITIES: No edema.  SKIN: No rash. NEUROLOGIC:  Nonfocal.   LABS: Pending.   ASSESSMENT AND  PLAN:  Severe abdominal pain-most consistent with, diverticulitis. We will plan abdominal pelvic CT to rule out appendicitis. This could be Clostridium difficile and IV Flagyl should cover that Clostridium difficile toxin by stool pending. We will treat with her with IV Cipro and Flagyl, Morphine, IV Protonix to cover nonsteroidal-induced gastritis, hydration for her profoundly hypovolemic, dehydrated state and Zofran IV for intractable nausea and vomiting. Overall prognosis is guarded.    ____________________________ Danella PentonMark F. Miller, MD mfm:cc D: 02/06/2013 18:03:38 ET T: 02/06/2013 18:38:33 ET JOB#: [id number]  cc: Danella PentonMark F. Miller, MD, <Dictator> Danella PentonMARK F MILLER MD ELECTRONICALLY SIGNED 02/07/2013 19:32

## 2014-08-24 NOTE — Consult Note (Signed)
PATIENT NAME:  [name], [name] MR#:  [id number] DATE OF BIRTH:  1950-06-08  DATE OF CONSULTATION:  03/03/2013  REFERRING PHYSICIAN:   CONSULTING PHYSICIAN:  Manya Silvas, MD  The patient is a 64 year old white female who was hospitalized earlier this month for problems with C. 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 She was admitted on 02/06/2013, discharged 02/14/2013. Since discharge, she has not done well. On her best days, she is able to eat 2 very small meals. She often has regurgitant nausea and vomiting. She has mushy, non-formed stools. She has chronic abdominal pain, basically across the lower abdomen and some into the lower back. She denies any hematemesis. Denies any melena. No hematochezia. She has lost 38 pounds in the last 2 months. This includes when she was hospitalized for an ankle fracture.   The patient also developed C. diff colitis and was treated for this during her last hospitalization. She was tried on Flagyl and did not tolerate Flagyl and was changed to vancomycin, and she has finished a course of vancomycin about 5 or 6 days ago. She also was given a course of antibiotics for diverticulitis, started on Augmentin and switched to Ceftin, which she did not tolerate the Augmentin. Before discharge, a followup CAT scan showed some improvement in her inflammatory condition; however, again, she has not made significant progress since discharge 2 weeks ago.   The patient denies any known heart disease. No known lung disease.   PAST SURGICAL HISTORY: D and C, gallbladder removal, BTL and left humeral fracture, as well as ankle surgery.   Her last colonoscopy was 2005. She has no history of Crohn disease or ulcerative colitis. She did make some progress with nausea and vomiting while in the hospital the last time. CT scan of the abdomen initially showed severe inflammatory changes of the terminal  ileum and sigmoid colon.   PHYSICAL EXAMINATION: GENERAL: Pleasant white female in no acute distress.  HEAD: Atraumatic. Sclerae anicteric. Conjunctivae negative. Tongue negative.  CHEST: Clear.  HEART: Shows no murmurs or gallops I can hear.  ABDOMEN: There is a fullness in the left lower quadrant that is somewhat tender. No palpable mass in the right lower quadrant. No strongly positive peritoneal signs.  EXTREMITIES: No edema.   LABORATORY DATA:  Albumin 2.5, creatinine 0.58, BUN 5. White count 11.3, hemoglobin 13, platelet count 372. Total protein 8, total bili 0.4, alk phos 74, SGOT 22, SGPT 16, sodium 138, potassium 3.9, chloride 100, CO2 of 29, calcium 9.5, amylase 32.   The patient says that if she has to drink oral contrast for CAT scan, she has trouble with nausea and vomiting. I told her we could give her extra doses of Zofran and of Phenergan as well.   ASSESSMENT: 1.  Some degree of poor nutrition because of illness and weight loss running over the last 2 months. She has a low albumin. I am going to check a prealbumin and see how low it is.  She may need parenteral hyperalimentation to recover from surgery. Because of her prolonged weight loss, low albumin and C. diff infection, I am going to check a serum protein immunoelectrophoresis and see if she has low gammaglobulin.   Palpation of the abdomen shows abnormality in the left lower quadrant. This could be diverticulitis. This could be Crohn disease. Possibility of a colonic neoplasm is also present.  CAT scan may be helpful for this. She may need a barium  enema and/or a colonoscopy for evaluation of this abnormality, and also for her previously identified problems on the CAT scan showing diverticulitis with possible fistula and terminal ileum inflammation.   We will await stool C. diff study. If her C. diff is positive, I would give her Dificid, which is a capsule form, because she does not tolerate Flagyl and vancomycin given as a  liquid has been known to produce nausea and vomiting, especially in someone who is already prone to have nausea and vomiting.   Because of her persistent nausea and vomiting, an upper endoscopy should probably be performed. Probably the best time would be at the time of colonoscopy.   Dr. Pat Patrick has been consulted and has seen the patient. I will confer with him on possible routes to take to try to restore this woman to previous health. I or Dr. Rayann Heman will follow with you.   ____________________________ Manya Silvas, MD rte:dmm D: 03/03/2013 20:33:22 ET T: 03/03/2013 21:01:12 ET JOB#: [id number]  cc: Manya Silvas, MD, <Dictator> Rusty Aus, MD Arther Dames, MD Micheline Maze, MD Manya Silvas MD ELECTRONICALLY SIGNED 04/04/2013 15:04

## 2014-08-24 NOTE — Consult Note (Signed)
Chief Complaint:  Subjective/Chief Complaint Still feels quite nauseous. Can drink water but any other drinks, feels more nauseous. Small BM yest.   VITAL SIGNS/ANCILLARY NOTES: **Vital Signs.:   09-Nov-14 05:05  Vital Signs Type Routine  Temperature Temperature (F) 99.4  Celsius 37.4  Temperature Source oral  Pulse Pulse 93  Respirations Respirations 18  Systolic BP Systolic BP 101  Diastolic BP (mmHg) Diastolic BP (mmHg) 65  Mean BP 77  Pulse Ox % Pulse Ox % 94  Pulse Ox Activity Level  At rest  Oxygen Delivery Room Air/ 21 %   Brief Assessment:  GEN no acute distress   Cardiac Regular   Respiratory clear BS   Gastrointestinal LLQ tenderness   Lab Results: Routine Chem:  07-Nov-14 04:53   Magnesium, Serum  1.5 (1.8-2.4 THERAPEUTIC RANGE: 4-7 mg/dL TOXIC: > 10 mg/dL  -----------------------)   Assessment/Plan:  Assessment/Plan:  Assessment Diverticulitis with abscess. Clinically not improving.   Plan Continue TPN. Will likely need surgical resection since cannot tolerate even clear liquid diet. Dr. Mechele CollinElliott to see patient tomorrow. Thanks.   Electronic Signatures: Lutricia Feilh, Elonda Giuliano (MD)  (Signed [phone]-Nov-14 08:13)  Authored: Chief Complaint, VITAL SIGNS/ANCILLARY NOTES, Brief Assessment, Lab Results, Assessment/Plan   Last Updated: 09-Nov-14 08:13 by Lutricia Feilh, Latricia Cerrito (MD)

## 2014-08-24 NOTE — Consult Note (Signed)
Details:   - I have seen and examined Ms [name] and agree with Wilhelmenia BlaseKaryn Earle's plan.    This is most likely penetrating diverticulitis ( she has had numerous episodes of what sounds like diverticulitis) but there is a small chance this could  be Crohn's disease.   Colonoscopy would be contraindicated at this time.   Will plan to treat with abx and then eventual colonoscopy as outpatient to r/o Crohn's disease.   Could perform SBFT in the mean time but doubt this will change management currently.    Will con to follow.   Electronic Signatures: Dow Adolphein, Tahsin Benyo (MD)  (Signed 07-Oct-14 17:00)  Authored: Details   Last Updated: 07-Oct-14 17:00 by Dow Adolphein, Esmeralda Blanford (MD)

## 2014-08-24 NOTE — Op Note (Signed)
PATIENT NAME:  [name], [name] MR#:  [id number] DATE OF BIRTH:  Jan 30, 1951  DATE OF PROCEDURE:  12/17/2012  PREOPERATIVE DIAGNOSIS: Unstable trimalleolar fracture dislocation, left ankle.  POSTOPERATIVE DIAGNOSIS:  Unstable trimalleolar fracture dislocation, left ankle.  OPERATIONS: Open reduction internal fixation, left ankle medially and laterally (1 AP compression screw, 6-hole LCP plate laterally, 2 cannulated cancellous screws medially).   SURGEON: Valinda HoarHoward E. Eliyah Mcshea, M.D.   ANESTHESIA: General LMA.   COMPLICATIONS: None.   DRAINS: None.   ESTIMATED BLOOD LOSS: None. Replaced: None.   DESCRIPTION OF PROCEDURE: The patient was brought to the operating room where she underwent satisfactory general LMA anesthesia in the supine position. The left foot and ankle were prepped and draped in sterile fashion. She had some small superficial abrasions which were isolated with an OpSite. These were not in the incisional area. A lateral longitudinal incision was made, dissection carried out sharply through subcutaneous tissue down to bone. The periosteum was elevated and the fracture site identified. It was a low, fairly short oblique. The joint was re-dislocated and the joint thoroughly irrigated free of hematoma and debris. The fracture was then reduced, held with a clamp and a single anterior-posterior compression screw inserted. A 6-hole LCP plate was then contoured to fit and was fixed to the fibula with 2 locking screws distally  below the fracture and 3 cortical screws proximally. Fluoroscopy showed good position and hardware alignment. The wound was irrigated and closed with 2-0 Vicryl and staples. A longitudinal medial incision was made away from the abrasions and dissection carried out sharply through subcutaneous tissue. The fracture site was identified and opened and irrigated free of all debris and hematoma. The fracture was reduced and held with a clamp. Two wires were inserted and two 50 mm  cannulated cancellous 4.0 mm screws were inserted and tightened snugly. This provided excellent fixation. Fluoroscopy showed the joint to be well reduced and the hardware all being in good position. The fractures were well aligned. The posterior malleolus was a small fragment which did not need fixing. The medial wound was irrigated and closed with 2-0 Vicryl and staples. A 0.5% Marcaine was placed in the joint and the tissues. A dry sterile dressing and posterior splint were applied. The tourniquet was deflated with good return of blood flow to the foot after 56 minutes. The patient was awakened and taken to recovery in good condition.   ____________________________ Valinda HoarHoward E. Leia Coletti, MD hem:nts D: 12/17/2012 17:42:56 ET T: 12/18/2012 00:20:52 ET JOB#: [id number]  cc: Valinda HoarHoward E. Emon Lance, MD, <Dictator> Valinda HoarHOWARD E Shammara Jarrett MD ELECTRONICALLY SIGNED 12/19/2012 16:58

## 2014-08-24 NOTE — Op Note (Signed)
PATIENT NAME:  [name], [name] MR#:  [id number] DATE OF BIRTH:  March 17, 1951  DATE OF PROCEDURE:  04/11/2013   PREOPERATIVE DIAGNOSIS: Partial small bowel obstruction.  POSTOPERATIVE DIAGNOSIS:  Partial small bowel obstruction, adhesive peritonitis.  PROCEDURE: Laparotomy, lysis of adhesions and enterocolostomy.     SURGEON: Renda RollsWilton Smith, M.D.   ANESTHESIA: General.   INDICATIONS: This 64 year old female has recently had diverticular abscess requiring sigmoid colectomy with Hartmann procedure. She later had small bowel obstruction with findings of multiple adhesions involving the distal ileum and also intra-abdominal abscess. She had lysis of adhesions and drainage of the abscess, but has continued to have problems with nausea, vomiting and pain and has not been able to progress to the point of getting adequate nutrition and had x-ray findings consistent with this partial small bowel obstruction and surgery was recommended for further treatment.   DESCRIPTION OF PROCEDURE: The patient was placed on the operating table in the supine position under general endotracheal anesthesia with nasogastric tube in place. The colostomy bag was removed and the skin surrounding the stoma was cleaned with Betadine solution. The remainder of the abdomen was prepared with ChloraPrep and draped in a sterile manner. I did use a 1 inch sticky border dressing to isolate the colostomy from the midline incision.   A midline incision was made above and below the umbilicus and carried down to the deep fascia. There was much induration of the fascia distally and the incision was extended up proximally and opened the peritoneal cavity. There were multiple dilated loops of small bowel. There was a severe adhesive peritonitis in the lower part of the abdomen with multiple loops of small bowel adherent to one another. There was no actual infection encountered. The incision was extended further in a cephalad direction to the point that  I could access the right transverse colon and there did not appear that I could safely access the cecum or ascending colon due to adhesive peritonitis. The small bowel was examined and was followed. It was markedly dilated and the location of the ligament of Treitz was demonstrated some small bowel contents were milked proximally up into the stomach and aspirated through the NG tube draining some 500 mL of bowel and gas through the NG tube. Next, multiple adhesions were taken down to try to  follow the small bowel distally and it appeared that somewhere in the midportion of the bowel was about as far as I could safely dissect distally due to adhesive peritonitis involving a portion of bowel adjacent to the right transverse colon. The contents were milked out of the bowel and held back with bowel clamp. The antimesenteric border of the small bowel and the tenia coli of the right transverse colon was elevated with Babcock clamps and made an enterotomy and a colotomy and introduced the 75 mm GIA stapler, which was engaged and activated to create a side to side anastomosis. The anastomosis was completed with application of the TA-30 stapler to close the remaining defect. The anastomosis was widely patent. The apex was imbricated with 5-0 Vicryl at both ends.  There was no visible contamination. The site was aspirated to remove some serosanguineous fluid, and it appeared that hemostasis was intact. It is noted that a number of bleeding points were cauterized during the course of the procedure. Next, the omentum was brought beneath the wound. The fascia was repaired with interrupted 0 Maxon figure-of-eight sutures. The skin was closed with clips and dressings were applied with paper tape.  The colostomy bag was applied using benzoin to treat the skin. The patient appeared to tolerate the procedure satisfactorily. A nasogastric tube left intact and now being prepared for transfer to the recovery room taken.      ____________________________ Shela Commons. Renda Rolls, MD jws:cc D: 04/11/2013 22:18:00 ET T: 04/11/2013 22:59:53 ET JOB#: [id number]  cc: Adella Hare, MD, <Dictator> Adella Hare MD ELECTRONICALLY SIGNED 04/15/2013 18:55

## 2014-08-24 NOTE — Consult Note (Signed)
Chief Complaint:  Subjective/Chief Complaint Covering for Dr. Mechele Collin. Still with significant nausea with clear liquids. No BM.   VITAL SIGNS/ANCILLARY NOTES: **Vital Signs.:   08-Nov-14 04:52  Vital Signs Type Routine  Temperature Temperature (F) 98.6  Celsius 37  Temperature Source oral  Pulse Pulse 86  Respirations Respirations 18  Systolic BP Systolic BP 101  Diastolic BP (mmHg) Diastolic BP (mmHg) 64  Mean BP 76  Pulse Ox % Pulse Ox % 92  Pulse Ox Activity Level  At rest  Oxygen Delivery Room Air/ 21 %   Brief Assessment:  GEN no acute distress   Cardiac Regular   Respiratory clear BS   Gastrointestinal tenderness in LLQ area   Lab Results: Routine Chem:  07-Nov-14 04:53   Magnesium, Serum  1.5 (1.8-2.4 THERAPEUTIC RANGE: 4-7 mg/dL TOXIC: > 10 mg/dL  -----------------------)   Radiology Results: CT:    01-Nov-14 14:03, CT Abdomen and Pelvis With Contrast  CT Abdomen and Pelvis With Contrast   REASON FOR EXAM:    (1) pain, tenderness, vomiting, F/U previous abnormal   CT; (2) same;    NOTE: Nur  COMMENTS:   LMP: Post-Menopausal    PROCEDURE: CT  - CT ABDOMEN / PELVIS  W  - Mar 04 2013  2:03PM     RESULT: CT of the abdomen and pelvis is performed with 100 mL of   Isovue-300 iodinated intravenous contrast and oral contrast with images   reconstructed at 3 mm slice thickness in the axial plane. Comparison is   made to the previous study of 02/13/2013.    There is persistent significant phlegmonous change an inflammatory   process in the pelvis which appear to represent colitis or diverticulitis   with possible contained perforation. No well-defined abscess is   appreciated the early abscess formation is not excluded adjacent to the     external iliac artery in the area of image 106. There is no air within   this region there is some fluid and/or phlegmonous change. There is   possible contained perforation seen in the area of image 116 and 117 as   well  is 118. Adjacent bowel loops show thickening of the wall. The uterus   is present adjacent to this process. The area adjacent to the external   iliac artery could certainly be the left ovary but given the extensive   inflammatory process in this region this is difficult to determine.    The urinary bladder contains no air. There is no significant ascites.   Trace amount of fluid is seen. There is no significant free air.   Cholecystectomy clips are present. There is fatty infiltration of the   liver. The kidneys show no gross obstructive change. The pancreas,   spleen, stomach and abdominal aorta appear to be unremarkable.    IMPRESSION:   1. Progressive inflammatory changes in the sigmoid colon region possibly   with contained perforation. Phlegmonous changes are noted. No definite   abscess evident. Close surgical followup is recommended with continued   antibiotic therapy.    Dictation Site: 6        Verified By: Elveria Royals, M.D., MD   Assessment/Plan:  Assessment/Plan:  Assessment Diveriticulitis with abscess. On TPN. Not tolerating clear liquids so far.   Plan If patient continues to have trouble tolerating clears over the weekend, then surgery will be necessary next week. Continue TPN. THanks.   Electronic Signatures: Lutricia Feil (MD)  (Signed [phone] 07:39)  Authored: Chief Complaint, VITAL SIGNS/ANCILLARY NOTES, Brief Assessment, Lab Results, Radiology Results, Assessment/Plan   Last Updated: 08-Nov-14 07:39 by Lutricia Feilh, Sylar Voong (MD)

## 2014-08-24 NOTE — Consult Note (Signed)
CC: vomiting, abd pain, diarrhea, malnutrition.  Discussed with pt, will get CT today of abd and pelvis, give phenergan and Zofran, she had some emesis this morning.  She is ketotic likely from poor intake, will change [facility] to [facility] for now.  C. diff was neg so can stop isolation. VSS afeb, chest clear, heart RRR. abd slightly less tender today.  Plan EGD and colonoscopy Monday with prep Sunday.  Dr. Michela PitcherEly note noted, agree with his assessment.    Electronic Signatures: Scot JunElliott, Nelma Phagan T (MD)  (Signed on 01-Nov-14 08:52)  Authored  Last Updated: 01-Nov-14 08:52 by Scot JunElliott, Jule Whitsel T (MD)

## 2014-08-24 NOTE — Consult Note (Signed)
Pt  feels a little better today, no vomiting, some nausea.  Abd still sore, no peritonal signs to gentle palpation.  She had vomiting several times yesterday. Alb 2.1, K 3.1, WBC 12 K VSS afeb.  Will discuss with Dr. Katrinka BlazingSmith tomorrow about surgical plans and timing of or utility of pre op colonoscopy/sigmoidoscopy.  CT yesterday suggested possible contained perforations with phlegmonous changes,.  Electronic Signatures: Scot JunElliott, Niva Murren T (MD)  (Signed on 02-Nov-14 10:45)  Authored  Last Updated: 02-Nov-14 10:45 by Scot JunElliott, Vaishnavi Dalby T (MD)

## 2014-08-24 NOTE — Consult Note (Signed)
Pt has been on a trial of IV antibiotics and TPN and it appears she will now need surgery as she has made very little progress.  The trial of conservative therapy was a reasonable thing to do because it demonstrates clearly to the patient and family that the decision to operate was not made in haste.  Agree with plans.   Electronic Signatures: Scot JunElliott, Olivea Sonnen T (MD)  (Signed on 10-Nov-14 17:47)  Authored  Last Updated: 10-Nov-14 17:47 by Scot JunElliott, Cordie Beazley T (MD)

## 2014-08-24 NOTE — Consult Note (Signed)
PATIENT NAME:  [name], [name] MR#:  [id number] DATE OF BIRTH:  12-Sep-1950  DATE OF CONSULTATION:  02/07/2013  REFERRING PHYSICIAN:   CONSULTING PHYSICIAN:  Adella Hare, MD  HISTORY OF PRESENT ILLNESS: This 64 year old female accompanied by her husband is in the hospital and request was made for surgery consultation with a chief complaint of abdominal pain. She was admitted by Dr. Bethann Punches. She reports that she was in her usual state of health until 1 week ago today, when she began to get some crampy abdominal pain and experienced some constipation, later did take some milk of magnesia and eventually developed some profound watery diarrhea which was foul smelling. Also has had some nausea and vomiting, increasing abdominal pain, chills and temperature is high as 101. Some 4 days ago she was started on Augmentin but due to increasing pain and illness and prostration, she was admitted emergently to the hospital.   It is further noted that she has had a stool test demonstrating infection with Clostridium difficile. She also had CT scanning which I have reviewed the images which demonstrate marked thickening of the sigmoid colon, thickening of the terminal ileum and, according to radiologist, a possible fistulous connection between the terminal ileum and the sigmoid colon. There was no definite bowel preparation or there was no free air found. Patient was admitted emergently yesterday, was started on Cipro and Flagyl, and has been able to sip some clear liquids. I have seen her twice today. I saw this morning and did find she had some mild left lower quadrant tenderness, mild right lower quadrant tenderness. After discussion with Dr. Hyacinth Meeker, I did place her on Zosyn and discontinued the Cipro and continued the Flagyl.   She reports that during the course of the day, she has continued to drink some clear liquids which she tolerated well. She had, prior to admission, had nausea and vomiting but has had no  nausea and vomiting now for 2 days. She reports no fever today. She has had maximum amount of some 12 loose watery stools per 24 hours at home but she has just had 2 loose bowel movements today, has done just some minimal walking in the room, has been confined to isolation, feels much better today.   PAST MEDICAL HISTORY: Reviewed. She does report that a number years ago she did have a spell in which she had a fairly high blood pressure, was admitted to the hospital for a number of days, did not have any stroke or any focal neurologic deficit. She reports no history of heart disease, lung disease or hepatitis. She does report that she has had a number of bouts of left lower quadrant pain in the past which have been treated with antibiotics and have been attributed   to diverticulitis.  It was further noted that she did have a colonoscopy in 2005 and she reports no known history of any inflammatory bowel disease.   She did have a fracture of her left ankle in August and had to have plate and screws. She has 2 incisions in her ankle, 1 medial, 1 lateral, and she did report that she was treated with antibiotics during the course of repair of her ankle. She had numerous antibiotics in August. With reference to antibiotics, she has not had any other cause for antibiotics between the ankle surgery and this admission.   She does have some history of low back pain and sciatica which actually has been better since the ankle fracture.  Did incidentally have a fracture of her right ankle as well but did not need surgery for her right ankle.   PAST SURGICAL HISTORY:  1.  Has had above-mentioned ankle surgery,  2.  Has had laparoscopic cholecystectomy.  3.  Has had tubal ligation.    MEDICATIONS: Include:  1.  Recent acetaminophen with hydrocodone for her ankle.  2.  Aspirin 81 mg daily.  3.  Etodolac 400 mg 2 times a day.  4.  Fish oil. 5.  Phenazopyridine 200 mg daily.  6.  Fosamax 70 mg weekly.  7.   Vitamin D.  8.  Recent Augmentin.   SOCIAL HISTORY: She is married and accompanied by her husband, does not smoke, does not drink any alcohol.   FAMILY HISTORY: Positive for diverticulitis and colitis and abdominal aortic aneurysm.   REVIEW OF SYSTEMS: She reports no other recent acute illness such as cough, cold or sore throat. She reports no difficulty breathing with walking. No chest pains. No swollen glands. No sores or boils. She does report gradual improvement from her ankle fractures, has been doing some walking. She reports that she is voiding satisfactorily.  Her 10-point review of systems otherwise negative.   PHYSICAL EXAMINATION: GENERAL: She is awake, alert and oriented, resting in the hospital bed.  VITAL SIGNS: Temperature is 98.5, pulse 73, respirations 20, blood pressure 126/72, pulse ox 92% on room air.  SKIN: Warm and dry without rash.  HEENT: Pupils equal, reactive to light. Extraocular movements are intact. Sclerae clear. Palpebral conjunctivae, slightly pale. Pharynx minimally visible due to protuberance of her tongue.  NECK: No palpable mass.  LUNGS: Lung sounds are clear. No respiratory distress.  HEART: Regular rhythm, S1 and S2 without murmur.  ABDOMEN:  Moderately obese and soft. There is mild left lower quadrant tenderness and  slightly more mild right lower quadrant tenderness. No palpable mass. No chronic guarding. External anal appearance does demonstrate some redness and appearance of acute mild dermatitis. No ulcerations seen. Digital exam demonstrates good sphincter tone. No palpable rectal mass. No significant pelvic tenderness encountered.  NEUROLOGIC: Awake, alert, oriented, moving all extremities.  EXTREMITIES: With no dependent edema. I did rewrap her right ankle and foot. Her incisions on both the lateral aspect of the ankle and the medial aspect both appeared to be healing satisfactorily. There is no redness or swelling around this site.   IMAGING:  CT  scan, I did reviewed the images to see any thickening of the wall of the sigmoid colon, thickening of the wall of the terminal ileum and noted that radiologist is concerned about possible fistula between the terminal ileum and the sigmoid colon.   LABORATORY DATA:  Her lab work was viewed. Her kidney function is good. Potassium slightly low at 3.3. Liver panel with low albumin of 2.4. CBC with a white blood count 9200, hemoglobin 12.1. Her C. difficile test was positive. Urinalysis was with no bacteria seen, 1+ leukocyte esterase.   IMPRESSION:  1.  Probable diverticulitis of the sigmoid colon.  2.  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  3.  Clostridium difficile colitis.   PLAN: 1.  This was discussed with Dr. Hyacinth Meeker.   2.  I do not think there is any indications for emergency surgery at present.  3.  I did talk to her about taking the clear liquids today.  4.  Suggest tomorrow she could possibly advance to some Ensure or possibly even to  full liquids.  5.  I would think she ought to continue with the antibiotics here at the hospital at least for a number of days and see if she is making good progress prior to discharge.  6.  I think in 6 or 8 weeks it would be helpful to do colonoscopy.  7.  I noted Dr. Coral Elseyan's note suggested a small bowel x-ray at some point in which this may help to tell if she has a fistula or not. 8.  With regard to antibiotics, I would suggest that when she goes home she go home on either Flagyl, which seems to be helping, or other choice is vancomycin and would continue the Zosyn at present although when she goes home could go back to Cipro for a full course of some 7 to 10 days for the apparent diverticulitis and would treat her for some 10 to 14 days for her Clostridium difficile infection.   ____________________________ Shela CommonsJ. Renda RollsWilton Jahbari Repinski, MD [email or web address]:cs D: 02/07/2013 20:25:00 ET T: 02/07/2013 20:45:18  ET JOB#: [id number]  cc: Adella HareJ. Wilton Sakira Dahmer, MD, <Dictator> Adella HareWILTON J Daisee Centner MD ELECTRONICALLY SIGNED 02/08/2013 18:21

## 2014-08-24 NOTE — Op Note (Signed)
PATIENT NAME:  [name], LIMBURG MR#:  [id number] DATE OF BIRTH:  1951-01-05  DATE OF PROCEDURE:  03/14/2013  PREOPERATIVE DIAGNOSIS: Diverticulitis.   POSTOPERATIVE DIAGNOSIS: Diverticulitis.   PROCEDURE: Sigmoid resection with end sigmoid colostomy.   SURGEON: Renda Rolls, M.D.   ANESTHESIA: General.   INDICATION: This 64 year old female has had recent prolonged abdominal pain, tenderness, anorexia, nausea, weight loss, and has been treated with a course of antibiotics and failed to improve and surgery was recommended for definitive treatment.   DESCRIPTION OF PROCEDURE: In the supine position under general endotracheal anesthesia, the legs were elevated into the lithotomy position using bumblebee stirrups. The circulating nurse inserted a Foley urinary catheter with Betadine preparation of the perineum, draining clear yellow urine.  I did a digital rectal exam and there was no significant amount of stool found within the rectum.   The perineum was prepared with Betadine solution and the abdomen was prepared with ChloraPrep, and the abdomen was draped out in a sterile manner.   A lower abdominal midline incision was made from the left of the umbilicus down to the pubic symphysis, carried down through subcutaneous tissues. Numerous small bleeding points were cauterized. The midline fascia was incised. The peritoneum was incised and the peritoneal cavity was opened. Initial inspection revealed there was no palpable mass within the liver. There was a large hard phlegmon in the lower abdomen associated with the sigmoid colon. The sigmoid colon proximal to this appeared to be soft and pliable. The portion of the small bowel was  maneuvered away from the sigmoid colon and packed with moist lap pack. This phlegmon was primarily in the sigmoid mesentery and also was densely adherent to the uterus. The right ovary was observed and appeared to be normal. The tube on the right was normal, and during the  course of the procedure did not identify the left ovary. Did note the appendix was with the tip extending far down into the pelvis. It was encased in scar tissue and phlegmon. I could only see just about 1 inch of the appendix, which what I could see appeared normal. The cecum and ascending colon appeared normal. What small bowel was visible appeared normal. Next, this phlegmon was dissected as the sigmoid colon was mobilized with incision of the peritoneal reflection on the left side. The bowel just proximal to this phlegmon at a point where the bowel appeared to be comparatively normal, a window was created in the mesentery and the bowel was divided with a GIA stapler. Next, mesenteric dissection was begun with the Harmonic scalpel, and additional dissection was carried out on both sides of this large phlegmon involving the sigmoid mesentery. The phlegmon was finger fractured off the uterus and extended down past the largest portion of the phlegmon and identified bowel below the phlegmon which extended farther down into the pelvis. It is noted that in the course of the dissection of the mesentery, one bleeding point was suture ligated with 0 chromic. Dissection was carried down just posterior to the uterus. There was much dense inflammation and scarring and reached a point where it appeared that the bowel could be divided and placed the green cartridge TA 30 stapler across the bowel, activated it, and divided just above the staple line and removed this segment of the sigmoid colon, which was approximately 6 inches in length, and did find on the side table that there was marked narrowing in the lumen. Gloves were changed. The operative field was further inspected. Several  small bleeding points were cauterized. This was observed for several minutes and there was just some very minimal degree of oozing remaining. It is noted that during the course of the dissection of the phlegmon, there did appear to be some  necrotic tissue encountered as it was dissected, but no abscess encountered.   I did insert the 25 mm EEA sizer into the rectum, and this extended approximately 6 inches up into the rectum but would not reach up to the point of the staple line. Next, plans for the colostomy were made, as it did not appear to be safe to create an anastomosis. The proximal portion of bowel was mobilized by dissecting portions of the mesentery, and the mesentery appeared to be somewhat short and did have to divide one mesenteric artery between chromic ligatures, placing a suture ligature on the proximal side, and the mesentery was further mobilized until the bowel would reach far enough to be used for a colostomy. Next, midway between the umbilicus and the anterior/superior iliac spine, a circular incision was made large enough for the colostomy and carried down through subcutaneous tissues, which were approximately 3 cm deep extending down to the deep fascia, where a cruciate incision was made in the anterior rectus sheath. Rectus muscle fibers were spread. The posterior sheath was also incised and the track was dilated large enough to admit 2 fingers. The bowel was brought up through the abdominal wall, but had to be further dissected so that some fatty tissue was excised from the bowel wall with the Harmonic scalpel. I subsequently could bring the bowel far enough through the abdominal wall to be satisfactory for colostomy.   Next, the operative area was further inspected. A small amount of blood and fluid was aspirated. It appeared that hemostasis was satisfactory. Next, the lap pack was removed and lap pack counts were correct. The peritoneum was closed with a running 0 chromic. The gloves, gown, instruments and table were exchanged for clean instruments. Next, the midline fascia was closed with interrupted 0 Maxon figure-of-eight sutures. Hemostasis appeared to be intact and the skin was closed with clips.   This was  covered with a sterile towel and subsequently matured the colostomy. A fragment of end of the bowel was excised with electrocautery and also a fragment of fatty tissue excised. The colostomy was carried out with 0 Vicryl sutures, suturing the seromuscular coat and a full thickness of the cut end to the skin, creating a rosette. The colostomy bag was applied using benzoin and a wafer. Gloves were changed. Dry gauze dressings were applied and paper tape. Foley catheter was left in for monitoring. The patient appeared to be in satisfactory condition.   Estimated blood loss for the procedure was 300 mL.   It is noted that at the beginning of the case, I observed that the patient had received her dose of Zosyn at 9:37 this morning, next dose due at approximately 5:00 in the afternoon, so we were treating her with a therapeutic course of Zosyn and so there was not an indication for a prophylactic antibiotic, as she is being treated therapeutically and will be kept on her schedule of  Zosyn during the postoperative course.   ____________________________ J. Renda RollsWilton Smith, MD [email or web address]:jcm D: 03/14/2013 15:50:59 ET T: 03/14/2013 18:58:37 ET JOB#: [id number]  cc: Adella HareJ. Wilton Smith, MD, <Dictator> Adella HareWILTON J SMITH MD ELECTRONICALLY SIGNED 03/16/2013 10:26

## 2014-08-24 NOTE — Consult Note (Signed)
Pt has somewhat elevated and erratic blood glucose.  She may need home TPN eventually while healing up.  I have ordered a pharmacy consult for TPN and insulin management.    Electronic Signatures: Scot JunElliott, Ashyra Cantin T (MD)  (Signed on 12-Nov-14 17:51)  Authored  Last Updated: 12-Nov-14 17:51 by Scot JunElliott, Jamarcus Laduke T (MD)

## 2014-08-24 NOTE — Consult Note (Signed)
Given elevated WBC and vomiting of new onset I would keep a low threshold to repeat a CT to check for abcesses.  Electronic Signatures: Scot JunElliott, Robert T (MD)  (Signed on 13-Nov-14 17:18)  Authored  Last Updated: 13-Nov-14 17:18 by Scot JunElliott, Robert T (MD)

## 2014-08-24 NOTE — Consult Note (Signed)
PATIENT NAME:  [name], [name] MR#:  [id number] DATE OF BIRTH:  07-Sep-1950  DATE OF CONSULTATION:  03/06/2013  REFERRING PHYSICIAN:   CONSULTING PHYSICIAN:  Adella HareJ. Wilton Smith, MD  HISTORY OF PRESENT ILLNESS: This 64 year old female accompanied by her husband is a patient of Dr. Bethann PunchesMark Miller and was referred by Dr. Mechele CollinElliott, has a chief complaint of abdominal pain. She has an extended illness. She reports that she had had an ankle injury and surgery in August and was treated with antibiotics. She was recently in the hospital several weeks ago and had diarrhea including Clostridium difficile, was treated with an intravenous course of vancomycin which was begun as inpatient and continued as an outpatient. She also had CT findings of evidence of colitis, possible diverticulitis and during the time of discharge she reports poor progress. She had poor nutrition, poor tolerance of solid or liquids, frequent vomiting, frequent episodes of diarrhea, as many as 10 small loose bowel movements per day. She was able to take enough nutrition to be able to empty her bladder satisfactorily. She reports limited activity level, generally feeling fatigued. She has lost approximately 38 pounds since August.   She has been readmitted to the hospital with persistent lower abdominal pain, nausea, vomiting, diarrhea. A repeat C. diff study was negative. On a flexible proctosigmoidoscopy done earlier today, the scope went into the rectosigmoid junction, appeared to be some evidence of external compression and the scope would not easily go beyond this point. She also had upper endoscopy with some evidence of some inflammation in the stomach but no ulcers.   Since admission has been treated with ertapenem. She is not currently on a diet.    PAST MEDICAL HISTORY:  1.  A number of years ago she did have a spell of high blood pressure, was admitted to the hospital for a number of days and eventually resolved. She otherwise reports no  history of any heart disease or lung disease or hepatitis.  2.  Has had bouts of left lower quadrant pain in the past treated with antibiotics, attributed to diverticulitis. She did have colonoscopy 2005.  3.  Did have a fracture of her left ankle in August and had surgery and antibiotics, and has been making good progress. Has recently been discharged from orthopedic care.  4.  History of low back pain and sciatica.   PAST SURGICAL HISTORY:  Previous surgery includes the above-mentioned ankle surgery. Also has had laparoscopic cholecystectomy. She has had tubal ligation.   SOCIAL HISTORY: She is accompanied by her husband. Does not smoke. Does not drink any alcohol.   FAMILY HISTORY: Positive for diverticulitis, colitis and abdominal aortic aneurysm.   MEDICATIONS: Include promethazine 12.5 mg oral p.r.n., tramadol 50 mg p.r.n.   ALLERGIES:  No known drug allergies.   REVIEW OF SYSTEMS: Generally has felt tired. She has not been having any fevers. She reports breathing satisfactorily. No chest pains. Voiding satisfactorily. Having no ankle edema. Review of systems otherwise negative.   PHYSICAL EXAMINATION: GENERAL: She is awake, alert and oriented, resting in the hospital bed, has IV in the left antecubital fossa.  VITAL SIGNS: Temperature is 99.6, pulse 72, respirations 15, blood pressure 121/75, pulse ox is 96%.  SKIN: Warm and dry without rash or jaundice.  HEENT: Pupils equal, reactive to light. Extraocular movements are intact. Sclerae clear. Pharynx clear.  LUNGS: Sounds were clear. No respiratory distress.  HEART: Regular rhythm, S1 and S2.  ABDOMEN: Moderate left lower quadrant tenderness. No significant  guarding. No palpable mass. Remainder of abdomen is soft and nontender.  RECTAL: Not repeated.  EXTREMITIES: No dependent edema.  NEUROLOGIC: Awake, alert and oriented, moving all extremities.   CLINICAL DATA:  1.  Her creatinine is 0.65, BUN is 2.1. White blood count 10,000,  hemoglobin 11.8, platelet count 328,000.  2.  CT images were reviewed which do demonstrate evidence of inflammation around the sigmoid colon consistent with diverticulitis, possible phlegmon, possible small contained abscess.   IMPRESSION:  Diverticulitis.   RECOMMENDATIONS:   1.  I discussed with her the options for a sigmoid colon resection and discussed that if we did a sigmoid colon resection she may need to have a temporary colostomy as it may not be safe to perform an anastomosis.  2.  I also discussed with her the other option of intravenous antibiotic therapy and intravenous parenteral nutrition so I think there is an option about which direction to take.  3.  After discussion, she much prefers continued medical treatment, antibiotics and parenteral nutrition.   I did discuss this. Will see if we can have a PICC line inserted for her nutrition and certainly follow her from day to day to watch for signs of improvement. I discussed that if there is no improvement she may still need to have surgery.   ____________________________ Shela Commons. Renda Rolls, MD [email or web address]:cs D: 03/06/2013 18:09:56 ET T: 03/06/2013 18:51:18 ET JOB#: [id number]  cc: Adella Hare, MD, <Dictator> Adella Hare MD ELECTRONICALLY SIGNED 03/06/2013 20:05

## 2014-08-24 NOTE — Consult Note (Signed)
PATIENT NAME:  [name], [name] MR#:  [id number] DATE OF BIRTH:  01/27/1951  DATE OF CONSULTATION:  02/07/2013  REFERRING PHYSICIAN:  Dr. Hyacinth Meeker. DICTATING PROVIDER:  Hardie Shackleton. Colin Benton, PA-C. CONSULTING AND ATTENDING GASTROENTEROLOGIST: Dow Adolph, MD.  REASON FOR CONSULTATION: Colitis and abnormal CT scan.  HISTORY OF PRESENT ILLNESS: This is a pleasant 64 year old female who was admitted to [facility] yesterday with complaints of lower abdominal pain and diarrhea. She states that pain began about 5 days ago, last week, and was quite significant. She saw her primary care physician who gave her a prescription for Augmentin. Pain became intolerable and she presented to the hospital yesterday, during which time she was admitted.   CT scan of the abdomen and pelvis was obtained, showing severe inflammatory changes of the terminal ileum and sigmoid colon with a possible fistula formation versus abscess due to a linear collection of air that was noted. There was also mesenteric thickening in that area.   Due to these abnormal findings, she was admitted and started empirically on Cipro and Flagyl. She does have a history of diverticulosis and there was concern that this could be potentially diverticulitis versus underlying inflammatory bowel disease versus infectious. Stool studies were submitted and she actually was later found to be positive for C. diff.   After this finding, she was discontinued on the Cipro and now the patient continues Flagyl and was started on Zosyn as well.   Since being admitted, the patient has noticed gradual improvement and states that last night she did have a difficult time sleeping and the pain was quite severe, but this morning was a little bit better, and this afternoon is even better than it was this morning. Her bowel habits are slowly starting to become more formed but she has already gone to the restroom twice today and it was still loose and it did still have a foul odor.    There was also associated urgency where when she feels the urge to use the rest room, she needs to get up immediately.   She denies any fever or chills. There was some nausea and vomiting upon admission, but since being in the hospital, she states this is significantly improved. She is tolerating a clear liquid diet without making the pain or nausea or vomiting worse. There are no black or bloody stools that she has noticed.   She has had a colonoscopy in the past and has been told that she has diverticulosis. Last colonoscopy that I could find was in 2005 with no mention of diverticulosis. She has no personal history of inflammatory bowel disease that she is aware of. No one in the family that she is aware of has these conditions or any underlying colon polyps or colon cancer. She was started on IV Protonix as well and a surgical consult was put in at the same time with Dr. Katrinka Blazing.   No unintentional weight changes. No chest pain or shortness of breath.  PAST MEDICAL HISTORY: History of a TIA, vitamin D deficiency, and diverticulosis.  MEDICATIONS: Vitamin D, Fosamax, aspirin, gabapentin, Tylenol, Zofran, and Augmentin.   PAST SURGICAL HISTORY: Left humerus fracture repair, cholecystectomy, D and C, and a BTL (bilateral tubal ligation).  FAMILY HISTORY: There is no known family history of GI malignancy, colon polyps, or inflammatory bowel disease.  ALLERGIES: No known drug allergies.  REVIEW OF SYSTEMS: A 10-system review of systems was obtained on the patient. Pertinent positives are mentioned above and otherwise negative.  OBJECTIVE:  VITAL SIGNS: Blood pressure 131/73, heart rate 83, respirations 20, temp 98.2, bedside pulse oximetry 97% on room air. GENERAL: A pleasant 64 year old female resting quietly and comfortably in the exam room in no acute distress. Alert and oriented x3. HEAD: Atraumatic, normocephalic.  NECK: Supple. No lymphadenopathy noted. ENT: Sclerae anicteric. Moist  membranes moist. PULMONARY: Respirations are even and unlabored. Good auscultation bilateral anterior lung fields. CARDIAC: Regular rate and rhythm, S1, S2 noted.  ABDOMEN: Soft, nondistended. Very mild tenderness to palpation only with deep palpation noted in bilateral lower quadrants. No guarding or rebound. No masses, hernias, or organomegaly appreciated. Normoactive bowel sounds noted in all 4 quadrants.  PSYCHIATRIC:  Appropriate mood and affect. EXTREMITIES: Negative for lower-extremity edema and with 2+ pulses noted bilaterally.  LABORATORIES: White blood cells 9.2, hemoglobin 12.1, hematocrit 34.6, platelets 374, MCV 92. Sodium 137, potassium 3.3, BUN 6, creatinine 0.63, glucose 77. LFTs within normal limits. Lipase 195. Urinalysis is negative.   IMAGING: A CT scan was obtained on the patient of the abdomen and pelvis. Notable for severe inflammatory changes of the terminal ileum and sigmoid colon. There is a linear collection of air between the sigmoid colon and terminal ileum suggesting fistula versus abscess formation. There is mesenteric thickening in the surrounding area. Differential diagnosis could include inflammatory bowel disease such as Crohn's versus diverticulitis versus infectious colitis. No evidence of bowel obstruction.   C. diff was positive.  ASSESSMENT:  1.  Clostridium difficile colitis. 2.  Abnormal CT scan notable for severe inflammatory changes of the terminal ileum and sigmoid colon with a linear collection of air suggesting possible fistula formation. 3.  Bilateral lower quadrant abdominal pain, likely secondary to above.   4.  Nausea and vomiting, resolved. 5.  History of diverticulosis. 6.  Dehydration.  PLAN: I have discussed this patient's case in detail with Dr. Dow Adolph who is involved in the development of the patient's plan of care. We did spend some time reviewing the patient's labs and CT scan results, and certainly it was confirmed that she has C.  diff and therefore we do agree with her maintaining contact precautions and continuing Flagyl and Zosyn. We also agree with IV hydration as she did have some degree of dehydration upon presentation.   Based on the medical records, her last colonoscopy was in 2005 and there was no mention of diverticulosis, but she has been told that she has this condition in the past.   The overall clinical picture certainly is more suggestive of an acute diverticulitis on top of the C. diff infection, but certainly inflammatory bowel disease would need to be ruled out based on the possible fistula/abscess formation.   Therefore, we do recommend first treating the acute C. diff infection with IV antibiotics and once this acute episode has settled, we will likely recommend that she undergo colonoscopy 6 to 8 weeks down the line once everything is settled so we can evaluate and try to confirm what the true etiology of the abnormal CT scan was, whether or not this was diverticulitis versus possible Crohn disease.   Certainly pending her response to the IV antibiotics and per clinical course, we may also consider her for a small bowel followthrough as well depending on how she does.   This was expressed to the patient who verbalized understanding and all questions were answered. We will follow this patient closely.   Thank you so much for this consultation and for allowing [place] to participate in the patient's plan  of care.   These services provided by Brantley StageKaryn Mathea Frieling, NP, under collaborative agreement with Dr. Dow AdolphMatthew Rein.    ____________________________ Hardie ShackletonKaryn M. Kandee Escalante, PA-C kme:np D: 02/07/2013 15:52:29 ET T: 02/07/2013 16:33:39 ET JOB#: [id number]  cc: Hardie ShackletonKaryn M. Caitlen Worth, PA-C, <Dictator> Hardie ShackletonKARYN M Renessa Wellnitz PA ELECTRONICALLY SIGNED 02/08/2013 12:14

## 2014-08-24 NOTE — Consult Note (Signed)
PATIENT NAME:  [name], [name] MR#:  [id number] DATE OF BIRTH:  1950/06/23  DATE OF CONSULTATION:  03/03/2013  REFERRING PHYSICIAN:  Dr. Bethann PunchesMark Miller  CONSULTING PHYSICIAN:  Quentin Orealph L. Ely III, MD  CHIEF COMPLAINT:  Nausea, vomiting, diarrhea.   BRIEF HISTORY:  [name] is a 64 year old woman admitted directly from the primary care office with nausea, vomiting, and diarrhea. She has a complicated history dating back to October 6th. She was admitted to [facility] at that time with intractable nausea, vomiting, dehydration. She had significant left lower leg fracture in late August, and then developed an episode of abdominal pain, felt to be possible diverticulitis. She was treated with Augmentin. She had a syncopal spell and presented to the Emergency Room for further evaluation. She was felt to have CT evidence of diverticulitis. 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 There was no definite bowel perforation and no obvious free air. She was admitted emergently, started on antibiotics with Cipro and Flagyl. She was seen by Dr. Renda RollsWilton Smith at that time. She continued to have multiple stools. Workup at that time demonstrated Clostridium difficile enterocolitis. She was felt to have that diagnosis in addition to her underlying diverticulitis. She was discharged home on vancomycin in oral form and a cephalosporin. She continued to have multiple problems with abdominal discomfort, nausea, vomiting, anorexia, and had profound diarrhea. Over the last several days, she has been unable to keep anything in her stomach, becoming increasingly weak and dehydrated. She presented back to the office today where she was admitted to the hospital for further evaluation and intervention. She was seen by Dr. Dow AdolphMatthew Rein during her last admission, and the [facility] gastro[facility]ology service has been consulted at this time. She also has a surgical history with D and C of her ankle fracture, BTL, and a cholecystectomy. She  has a long history of diverticulosis. She has not had a colonoscopy in 8 to 10 years.   MEDICATIONS AT HOME:  Include no antibiotics at the present time. She was taking some Zofran for her nausea, but had been off pain medication and antibiotics since her last visit to the primary care office.   FAMILY HISTORY:  Noncontributory.   REVIEW OF SYSTEMS:  Positive only for the symptoms noted above.   PHYSICAL EXAMINATION:  GENERAL:  She is lying comfortably in bed.  VITAL SIGNS:  She is afebrile. Blood pressure is 112/82. Heart rate is 84 and regular. Oxygen saturation is satisfactory.  HEENT:  Exam is unremarkable with no scleral icterus. No pupillary abnormalities. No facial deformities.  NECK:  Supple, nontender with midline trachea. No adenopathy.  CHEST:  Clear with no adventitious sounds. Normal pulmonary excursion.  CARDIAC:  No murmurs or gallops. She seems to be in normal sinus rhythm.  ABDOMEN:  Soft with some left lower quadrant tenderness. No masses, no rebound, no guarding noted.  EXTREMITIES:  Exam reveals the surgical changes in her left ankle; otherwise full range of motion.  PSYCHIATRIC:  Normal orientation, normal affect.   I have reviewed her 2 previous CT scans. She does have significant inflammatory change in the sigmoid and distal colon. It is possible this is diverticulitis complicated by Clostridium difficile enterocolitis. She may indeed have a fistula, as there is significant inflammatory change in the lower abdomen. She appears to be incompletely  treated at this point. She is not on any antibiotic therapy presently. She should probably resume an antibiotic course despite her Clostridium difficile infection. I would recommend  repeat CT scan to see where we are with regard to the inflammatory change in this segment of bowel after her last scan 3 weeks ago. She probably also needs some direct visualization of her lower colon with a flexible sigmoidoscopy or colonoscopy. The GI  service has been consulted and is involved in the case. Dr. Renda Rolls of the [facility] surgery service saw the patient 3 weeks ago and consulted on her care, and we would defer surgical options and management of this patient to Dr. Katrinka Blazing. I have discussed this plan with the family and the gastroenterologist. We will be available to assist as necessary.     ____________________________ Quentin Ore III, MD rle:ms D: 03/03/2013 22:42:12 ET T: 03/03/2013 22:55:16 ET JOB#: [id number]  cc: Carmie End, MD, <Dictator> Danella Penton, MD Dow Adolph, MD Quentin Ore MD ELECTRONICALLY SIGNED 03/06/2013 9:59

## 2014-08-24 NOTE — H&P (Signed)
PATIENT NAME:  [name], [name] MR#:  [id number] DATE OF BIRTH:  Apr 01, 1951  DATE OF ADMISSION:  03/03/2013  HISTORY OF PRESENT ILLNESS:  This is a 64 year old female presents with intractable nausea and vomiting. She was hospitalized several weeks ago with severe C. difficile enterocolitis. At that time,  there was a questionable fistulous formation between the sigmoid colon and the terminal ileum with some mild free air. Surgery was consulted and they recommended conservative management. She received a week plus of IV Flagyl then ultimately switched over to vancomycin because of nausea. Her abdominal pain improved. She went home and really has not done well since. Whenever she eats, she starts throwing up. Initially it was thought to be intolerance of the medications, but now essentially she is off all medication and has just taken Zofran every time she eats. She is not getting sufficient nutrition in. Her bowels are moving but very pasty. She is not able really to eat any solid food and even keeping liquids down is marginal. Because of progressive abdominal pain and the nausea, vomiting and just doing poorly post hospitalization, she will be admitted for colonic obstruction symptoms.   PAST MEDICAL HISTORY AND MEDICAL ILLNESSES:  1.  C. difficile colitis.  2.  Left ankle fracture.  3.  Post menopausal state.  4.  Vitamin D deficiency.   PAST SURGICAL HISTORY:  1.  D and C.  2.  Cholecystectomy.  3.  BTL. 4.  Left humeral fracture.   ALLERGIES:  None.   MEDICATIONS:  1.  Zofran p.r.n.  2.  Tramadol 1/2 tab q.i.d. p.r.n. pain.   SOCIAL HISTORY:  Married, no smoking, no alcohol.   FAMILY HISTORY:  Noncontributory.   REVIEW OF SYSTEMS:  As above, otherwise negative.   PHYSICAL EXAMINATION:  VITAL SIGNS:  Blood pressure 110/72, pulse 110. Regular. Standing systolic pressure 90/50, heart rate goes up to 130, weight 168.  NECK:  No thyromegaly or bruits.  LUNGS:  Clear.  HEART:  Tachycardic.   ABDOMEN:  Bowel sounds present, soft, moderate tenderness left lower quadrant, mild on the right. No rebound.  EXTREMITIES:  No rash.  NEUROLOGIC:  Nonfocal.   LABS:  Pending.   ASSESSMENT AND PLAN:   1.  Malnutrition/dehydration/partial colonic obstruction:  We will get input from GI and Surgery. 2.  We will place her on IV fluids. Likely will need a barium enema to look for the point of obstruction and I suspect she will need surgery. 3.   Zofran for nausea, IV Protonix, morphine p.r.n. pain.   ____________________________ Danella PentonMark F. Miller, MD mfm:jm D: 03/03/2013 17:43:07 ET T: 03/03/2013 18:08:30 ET JOB#: [id number]  cc: Danella PentonMark F. Miller, MD, <Dictator> MARK Sherlene ShamsF MILLER MD ELECTRONICALLY SIGNED 03/06/2013 7:59

## 2014-08-24 NOTE — Discharge Summary (Signed)
PATIENT NAME:  [name], [name] MR#:  [id number] DATE OF BIRTH:  April 17, 1951  DATE OF ADMISSION:  02/06/2013 DATE OF DISCHARGE:  02/14/2013  DISCHARGE DIAGNOSES:  1.  Clostridium difficile enterocolitis.  2.  Severe diverticulitis with intestinal perforation.  3.  Osteoporosis.   DISCHARGE MEDICATIONS:  Align 1 tab daily, vancomycin 250 mg per 5 mL, 2.5 mL q.6h. for 14 days, pantoprazole 40 mg daily, Ondansetron 4 mg t.i.d. p.r.n. nausea, cefuroxime 250 mg Luna.i.d. x1 week, Vitamin D 50,000 units weekly.   REASON FOR ADMISSION: This 64 year old female presents with severe abdominal pain. Please see H and P for history of present illness, past medical history, and physical exam.   HOSPITAL COURSE: The patient was admitted and found to be positive for C. difficile. CT of the abdomen showed severe inflammatory changes of the terminal ileum and sigmoid colon, with signs of fistulous connection between the sigmoid and terminal ileum, with likely evidence of a small intestinal perforation. From that, she was treated with IV Zosyn and IV Flagyl, with clear liquids. Her abdominal pain improved slowly over the course. She was able to move her bowels some. Ultimately, an enema was used to get her bowels to move. She could not tolerate IV Flagyl well later in the hospitalization due to nausea. She was switched to p.o. vancomycin, tolerated that well. For her diverticulitis, she was switched to Augmentin, but could not tolerate that due to cramps and nausea, and she was switched to Ceftin and tolerated that reasonably well. Follow-up CT scan showed improvement in the inflammatory condition. Her bowels are moving okay. She will be on a soft diet for two weeks. She will follow up with Dr. Alycia Rossettiyan in two weeks and follow-up with me on Friday morning. Overall prognosis is guarded but good; ultimately will need colonoscopy  ____________________________ Danella PentonMark F. Martell Mcfadyen, MD mfm:cg D: 02/14/2013 07:17:40 ET T: 02/14/2013  07:39:25 ET JOB#: [id number]  cc: Danella PentonMark F. Elinore Shults, MD, <Dictator> Jora Galluzzo Sherlene ShamsF Elgene Coral MD ELECTRONICALLY SIGNED 02/15/2013 8:17

## 2014-08-24 NOTE — Consult Note (Signed)
Pt feeling a little better, abd less distended.  She talked with Dr. Katrinka BlazingSmith and the plan for now is to build her nutrition up with TPN  and give iv antibiotics and see how she responds.  She is very malnourished and may not heal well if surgery done now.  Abd exam with mild tenderness in LLQ.  Alb 2.1, prealbumin 6.  VSS afebrile.  Discussed current plans with her and her husband.  Electronic Signatures: Scot JunElliott, Reema Chick T (MD)  (Signed on 04-Nov-14 17:49)  Authored  Last Updated: 04-Nov-14 17:49 by Scot JunElliott, Yalanda Soderman T (MD)

## 2014-08-24 NOTE — Op Note (Signed)
PATIENT NAME:  [name], [name] MR#:  [id number] DATE OF BIRTH:  Sep 15, 1950  DATE OF PROCEDURE:  03/27/2013  She had surgery on November 24th and operative note was dictated postop, but not found in the medical record system.  PREOPERATIVE DIAGNOSIS: Small intestinal obstruction with recent history of diverticulitis  with multiple abscesses.   POSTOPERATIVE DIAGNOSIS:  Small intestinal obstruction with recent history of diverticulitis  with multiple abscesses.   PROCEDURE: Laparotomy with lysis of adhesions and drainage of intra-abdominal abscess.   SURGEON: Renda RollsWilton Smith, M.D.   ANESTHESIA: General.   INDICATIONS: This 64 year old female has recent illness with diverticulitis with multiple abscesses. She had a recent laparotomy with sigmoid resection with Hartmann procedure, and postoperatively she has developed small bowel obstruction. It is further noted she has had continued treatment with antibiotics due to her diverticulitis.   She was brought to the operating room and placed on the operating table in the supine position under general endotracheal anesthesia. The colostomy bag was removed. The tissues surrounding the ostomy were prepared with Betadine solution, and also the remainder of the skin of the abdominal wall was prepared with ChloraPrep. A 1 inch border OpSite dressing was applied to separate the colostomy from the intended site of the lower abdominal midline incision.   The lower abdominal midline incision was reopened and dissected down to encounter multiple Maxon sutures, which were removed, and the fascia was opened, and the peritoneal cavity was opened. There were findings of much inflammation in the lower abdomen. There were multiple loops of small intestine, which were adherent to the anterior abdominal wall. These were taken down with blunt dissection. There was a finding of dilated small bowel, which was proximal, and it was necessary to extend the incision around the left to  just above the umbilicus for better exposure. There were multiple loops of ileum adherent in the pelvis, and as these loops were mobilized with blunt and sharp dissection, there was a finding of an abscess, which was adjacent to the distal portion of bowel where there was a staple line found, and also a Prolene suture  which had been placed during the first operation. This abscess was approximately 5 mL of pus, which was aspirated. Also, a culture swab was sent for routine culture. The site was irrigated with saline solution and further aspirated. The loops of small bowel were further mobilized. These loops had thickened wall and much inflammation. There were multiple areas where the small bowel was tethered and these adhesions were lysed. There were also adhesions in the terminal ileum in the few inches leading up to the cecum, and these adhesions were lysed. I could subsequently demonstrate that the proximal small bowel was dilated and this was followed up to the ileocecal valve, and the small bowel was milked in the distal direction and could see that intestinal contents and gas flowed freely into the right colon causing some distention of the colon. There was no other abnormality demonstrated with the colon. Location of the appendix was demonstrated. I could only see just the portion of the appendix attached to the cecum, and also the right ovary was identified, but did not see the left ovary. The right ovary appeared normal, and seeing that the obstruction was resolved, the abdomen was further irrigated with saline solution and aspirated. Hemostasis appeared to be intact. Next, the fascial closure was carried out with interrupted 0 Maxon figure-of-eight sutures. Two 1/4 inch Penrose drains were placed into the subcutaneous tissues and sutured  to the skin with 3-0 nylon suture. The midline incision was closed with clips leaving this small opening for drainage with the Penrose drains. Dressings were applied.  Also, a new colostomy bag was applied using benzoin to treat the skin and the wafer applied and the bag applied.   The patient appeared to tolerate the procedure satisfactorily, and did note that a circulating nurse inserted a catheter at the beginning of the operation, and this was removed at the conclusion of the operation. The patient was prepared for transfer to the recovery room.     ____________________________ Shela Commons. Renda Rolls, MD jws:dmm D: 04/05/2013 11:16:17 ET T: 04/05/2013 11:32:43 ET JOB#: [id number]  cc: Adella Hare, MD, <Dictator> Adella Hare MD ELECTRONICALLY SIGNED 04/05/2013 18:51

## 2014-08-24 NOTE — Op Note (Signed)
PATIENT NAME:  [name], [name] MR#:  [id number] DATE OF BIRTH:  Feb 11, 1951  DATE OF PROCEDURE:  03/27/2013  PREOPERATIVE DIAGNOSIS: Small intestinal obstruction.   POSTOPERATIVE DIAGNOSIS: Small intestinal obstruction, intra-abdominal abscess.   PROCEDURE: Laparotomy, lysis of adhesions and drainage of abdominal abscess.   SURGEON: Adella HareJ. Wilton Smith, M.D.   ANESTHESIA: General.   INDICATION: This 64 year old female has had recent diverticulitis and sigmoid colon resection for diverticulitis with multiple abscesses. She has had postoperative findings of small bowel obstruction, and surgery was recommended for definitive treatment.   DESCRIPTION OF PROCEDURE: The patient was placed on the operating table in the supine position under general anesthesia. The circulating nurse inserted a Foley urinary catheter with Betadine preparation of the perineum, draining a clear yellow urine. The colostomy bag was removed. The abdomen was prepared with ChloraPrep. The skin around the ostomy was prepared with Betadine. The skin between the ostomy and the midline was treated with benzoin and OpSite barrier was applied with 1 inch adhesive band to separate the midline from the colostomy. Next, the abdomen was draped in a sterile manner.   An incision was made in the old scar which was carried down through subcutaneous tissues. There was a small hematoma which was evacuated. The Maxon sutures were removed from the deep fascia, and the fascia was separated and the peritoneal cavity was entered. There was a trace of ascites. There were multiple adhesions found. There dilated loops of small bowel. There were multiple dense adhesions found in the pelvis. There were dense adhesions between the small bowel and the stump of her distal bowel. The right ovary appeared normal. There was an abscess encountered which a swab was sent for culture. This abscess appeared to be small in size and was adjacent to the distal bowel stump, and  this pus was aspirated. Next, additional lysis of adhesions was carried out. There were 2 small serosal tears, each of which were repaired with 4-0 Vicryl simple sutures. The terminal ileum contained some dense adhesions, and a fold in the terminal ileum was straightened by lysis of dense adhesions. There were 2 other sites of partial obstruction which were lysed. Next, I could see that dilated proximal bowel was comparatively free of adhesions. It could be followed proximally and subsequently milked intestinal contents through the previously obstructed terminal ileum and could demonstrate that the fluid would flow into the large bowel. As noted, a small amount of blood was aspirated during the procedure. A number of small bleeding points were cauterized. The site was irrigated with saline solution and aspirated. Hemostasis subsequently appeared to be intact. The bowel was returned to the abdominal cavity. The fascial closure was carried out with interrupted 0 Maxon figure-of-eight sutures. A Penrose drain was inserted into the wound and brought out anteriorly between staples as the wound was closed with staples, and also the Penrose drain was held in place using two 1/4 inch Penrose drains and 3-0 nylon suture. Dressings were applied. The skin surrounding the stoma was treated with benzoin and a colostomy bag applied, and dressings were held intact with paper tape. The patient had good urine output during the procedure, and Foley catheter was subsequently removed.   Gram stain demonstrated gram-positive rods, gram-positive cocci and gram-negative rods, and decision was made to place the patient on postoperative Zosyn. The patient was prepared for transfer to the recovery room.    ____________________________ Shela CommonsJ. Renda RollsWilton Smith, MD jws:gb D: 03/27/2013 16:19:00 ET T: 03/28/2013 00:36:57 ET JOB#: [id number]  cc: Adella Hare, MD, <Dictator> Adella Hare MD ELECTRONICALLY SIGNED 04/26/2013 17:54

## 2014-08-24 NOTE — Consult Note (Signed)
Pt CC abd pain.  Pt walked around nurses station 2 times.  Taking clear liq only.  Nausea a bit but no vomiting.  Abd less tender, area of fullness almost gone.  Prealbumin was below 4, the lowest limit tested.  Continues on TPN, told her to let us know if she wants anything else to try to eat.  Discussed with Dr. Katrinka BlazingSmith.  Continue current treatment and check CT mid next week.  Electronic Signatures: Scot JunElliott, Robert T (MD)  (Signed on 06-Nov-14 18:18)  Authored  Last Updated: 06-Nov-14 18:18 by Scot JunElliott, Robert T (MD)

## 2014-08-25 NOTE — H&P (Signed)
PATIENT NAME:  [name], [name] DATE OF BIRTH:  1950/11/16  DATE OF ADMISSION:  07/08/2013  REFERRING PHYSICIAN:  Dr. Lenard LancePaduchowski.   PRIMARY CARE PHYSICIAN:  Dr. Bethann PunchesMark Miller.   CHIEF COMPLAINT:  Lower abdominal pain with nausea, vomiting and high output in the colostomy for two days.   HISTORY OF PRESENT ILLNESS:  The patient is a 64 year old Caucasian female is presenting to the ER with a chief complaint of a two day history of lower abdominal pain associated with nausea, vomiting and high output in the colostomy bag.  The patient went to see her doctor, Dr. Hyacinth MeekerMiller who has recommended to get 2 liters of IV fluids in his clinic.  Unfortunately, they were unable to get IV access and the patient was sent over to the ER for hydration.  The patient was just recently admitted to the hospital on October 31st and stayed in the hospital for more than six weeks and was discharged on 04/23/2013.  At that time, she was diagnosed with diverticulitis with abscess, small bowel obstruction as well.  She had three surgeries during the previous hospital course, was related to the diverticulitis, abscess and another one for small bowel obstruction and the last surgery for abscess for C. diff. colitis.  The patient was doing okay until two days ago and then started developing abdominal pain, nausea, vomiting and high output in the colostomy bag.  Denies any fever.  No recent travel.  No sick contacts.  She has received 2 liters of IV fluids in the ER and hospitalist team is called to admit the patient.  Her UA is positive and CT abdomen has revealed ongoing diverticulitis.  Initial stool test for C. diff. toxin A and Luna is negative.  During my examination, the patient is not complaining of much abdominal pain, but complaining of discomfort.  Husband is at bedside.  Denies any fever.  She was unable to keep any food down over the past two days.  Denies any loss of consciousness.  No chest pain, shortness of breath.   Denies any blood in her vomit.   PAST MEDICAL HISTORY:  Recent history of C. diff. colitis, left ankle fracture, postmenopausal state, vitamin D deficiency.   PAST SURGICAL HISTORY:  Recent admission during end of October the patient had three abdominal surgeries, one was an abscess post C. diff. colitis associated with diverticulitis, second one was related with an abscess and third surgery was related to small bowel obstruction.  D and C, cholecystectomy, tubal ligation bilaterally, left humeral fracture repair.   ALLERGIES:  No known drug allergies.   PSYCHOSOCIAL HISTORY:  Lives at home with husband.  No history of smoking, alcohol or illicit drug usage.   FAMILY HISTORY:  Noncontributory.   HOME MEDICATIONS:  Zofran 4 mg by mouth q. 4 hours as needed, Tylenol 325 mg 2 tablets by mouth q. 4 hours as needed for pain.   REVIEW OF SYSTEMS:  CONSTITUTIONAL:  Denies any fever, fatigue.  EYES:  Denies blurry vision, double vision.  EARS, NOSE, THROAT:  Denies epistaxis, discharge.  RESPIRATION:  Denies cough, COPD.  CARDIOVASCULAR:  No chest pain, palpitations.  GASTROINTESTINAL:  Denies hematemesis, but complaining of nausea, vomiting and high output in the colostomy.  GENITOURINARY:  No dysuria or hematuria. GYNECOLOGIC AND BREAST:  Denies breast mass or vaginal discharge.  Status post bilateral tubal ligation.   ENDOCRINE:  Denies polyuria, nocturia, thyroid problems.  HEMATOLOGIC AND LYMPHATIC:  No anemia, easy bruising,  bleeding.  INTEGUMENTARY:  No acne, rash, lesions.  MUSCULOSKELETAL:  No joint pain in the neck and back.  Denies gout.  NEUROLOGIC:  No vertigo, ataxia.  PSYCHIATRIC:  No ADD, OCD.   PHYSICAL EXAMINATION: VITAL SIGNS:  Temperature 98.2, pulse 92, respirations 18, blood pressure 148/70, pulse ox is 98%.  GENERAL APPEARANCE:  Not under acute distress.  Moderately built and nourished.  HEENT:  Normocephalic, atraumatic.  Pupils are equally reacting to light and  accommodation.  No scleral icterus.  No conjunctival injection.  No sinus tenderness.  No postnasal drip.  Dry mucous membranes.  NECK:  Supple.  No JVD.  No thyromegaly.  Range of motion is intact.  LUNGS:  Clear to auscultation bilaterally.  No accessory muscle usage.  No anterior chest wall tenderness on palpation.  CARDIAC:  S1 and S2 normal.  Regular rate and rhythm.  No murmurs.  GASTROINTESTINAL:  Soft.  Bowel sounds are positive in all four quadrants.  They are hyperactive.  Minimal abdominal discomfort is noticed in the lower part of the abdomen.  Midline abdominal scar is healing well.  Colostomy bag is intact.  No rebound tenderness.  NEUROLOGIC:  Awake, alert, oriented x 3.  Motor and sensory grossly intact.  Reflexes are 2+.  Cranial nerves II through XII are intact.  EXTREMITIES:  No edema or cyanosis.  No clubbing.  SKIN:  Warm to touch.  Dry in nature slightly decreased turgor from dehydration.  No rashes.  No lesions.  MUSCULOSKELETAL:  No joint effusion, tenderness, erythema.  PSYCHIATRIC:  Normal mood and affect.   LABORATORY AND IMAGING STUDIES:  UA, hazy in color, ketones trace, nitrite negative, leukocyte esterase 3+, WBC clumps are present.  C. diff. toxin A and Luna is negative, but C. diff. PCR test antigen is positive.  Toxin A and Luna are negative.  PCR for toxigenic C. diff. is negative.  Interpretation, negative for toxigenic C. diff.  It may be nontoxigenic strain of C. diff. bacteria present, lacking the ability to produce toxin.  WBC elevated at 18.3.  Hemoglobin, hematocrit and platelets are normal.  LFTs:  Total protein is elevated at 8.3, albumin 2.5.  The rest of the LFTs are normal.  Chem-8 is normal except sodium at 134.  CT of the abdomen and pelvis with contrast, no significant changes from 06/19/2013, ongoing inflammation and scarring in the pelvis related to previous diverticulitis.  No abscess or perforation.  Chronic findings are stable from prior.    ASSESSMENT  AND PLAN:  A 64 year old female presenting to the ER with a chief complaint of a two day history of lower abdominal pain, nausea, vomiting and high-output in the colostomy bag.  1.  Acute gastroenteritis/dehydration with high colostomy output.  We will admit her to med/surg floor.  We will keep her nothing by mouth, provide intravenous fluids, proton pump inhibitor for gastrointestinal prophylaxis.  Antinausea medication.  Surgical consult if needed.  2.  Ongoing inflammation from diverticulitis.  The patient will be on intravenous levofloxacin and Flagyl.  3.  Acute cystitis.  Urine cultures are pending.  We will provide her intravenous levofloxacin.  4.  Recent admission in October 2014 with three bowel surgeries and colostomy.  5.  History of vitamin D deficiency.  6.  We will provide gastrointestinal and deep vein thrombosis prophylaxis. 7.  CODE STATUS:  SHE IS FULL CODE.  Husband is the medical power of attorney.    Plan of care discussed with the patient.  She  is aware of the plan.   Total time spent on the admission is 45 minutes.  Please transfer the patient to Dr. Hyacinth Meeker in a.m.    ____________________________ Ramonita Lab, MD ag:ea D: 07/08/2013 00:30:26 ET T: 07/08/2013 00:53:21 ET JOB#: [id number]  cc: Ramonita Lab, MD, <Dictator> Ramonita Lab MD ELECTRONICALLY SIGNED 07/21/2013 0:50

## 2014-08-25 NOTE — Discharge Summary (Signed)
PATIENT NAME:  [name], [name] MR#:  [id number] DATE OF BIRTH:  [birth year]  DATE OF ADMISSION:  03/03/2013 DATE OF DISCHARGE:  04/23/2013  DISCHARGE DIAGNOSES:  1.  Diverticulitis with abscess.  2.  Small bowel obstruction.  3.  Moderate malnutrition.  DISCHARGE MEDICATIONS: Tylenol 650 mg q.4 hours p.r.n. pain and Zofran 4 mg q.4 hours p.r.n. nausea.   REASON FOR ADMISSION: The patient is a 64 year old female presents with severe abdominal pain. Please see H and P for HPI, past medical history and physical exam.   HOSPITAL COURSE: The patient was hospitalized for approximately 6 weeks. During that time, per surgical notes, she had 3 operations, one was abscess post C. difficile colitis with associated diverticulitis. The second one was related to an abscess. The third surgery was related to small bowel obstruction. During this time, she received TPN. In fact, she received TPN and conservative measures with IV antibiotics for 1 week prior to the first surgery. Surgical care was per Dr. Renda RollsWilton Smith during the entire hospitalization. She had significant problems with narcotics and many antibiotics with nausea from these. Ultimately Tylenol was used to control her pain. Her ostomy site looked good. Overall prognosis is guarded with her multiple surgeries. She will follow up with Dr. Renda RollsWilton Smith.  ____________________________ Danella PentonMark F. Miller, MD mfm:aw D: 05/03/2013 08:04:41 ET T: 05/03/2013 08:17:47 ET JOB#: [id number]  cc: Danella PentonMark F. Miller, MD, <Dictator> MARK Sherlene ShamsF MILLER MD ELECTRONICALLY SIGNED 05/04/2013 10:38

## 2014-08-25 NOTE — Discharge Summary (Signed)
PATIENT NAME:  [name], [name] MR#:  [id number] DATE OF BIRTH:  1950/06/19  DATE OF ADMISSION:  07/07/2013 DATE OF DISCHARGE:  07/11/2013  DISCHARGE DIAGNOSES:  1.  Partial small bowel obstruction. 2.  Gastroesophageal reflux disease.   DISCHARGE MEDICATIONS:  Temazepam 15 milligrams at bedtime, Protonix 40 milligrams daily.   REASON FOR ADMISSION: A 64 year old female who presents with intractable nausea and vomiting. Please see H and P for HPI, past medical history and physical exam.   HOSPITAL COURSE: The patient was admitted. An abdominopelvic CT scan showed no new infection or abscesses.  C. difficile was negative. She was hydrated and her potassium and magnesium were replaced. She came back to baseline state, eating well and will be discharged on the above medications.   FOLLOWUP:  With Dr. Hyacinth MeekerMiller in 2 weeks.    ____________________________ Danella PentonMark F. Tymika Grilli, MD mfm:mk D: 07/11/2013 07:51:01 ET T: 07/11/2013 09:04:44 ET JOB#: [id number]  cc: Danella PentonMark F. Remington Highbaugh, MD, <Dictator> Danella PentonMARK F Watson Robarge MD ELECTRONICALLY SIGNED 07/12/2013 8:34

## 2014-10-07 IMAGING — XA IR VASCULAR PROCEDURE
1 series · 1 of 1 positions shown · non-contrast
Comparison: none

[Series 2: single · 1 of 1 slices shown]
[im 1/1]
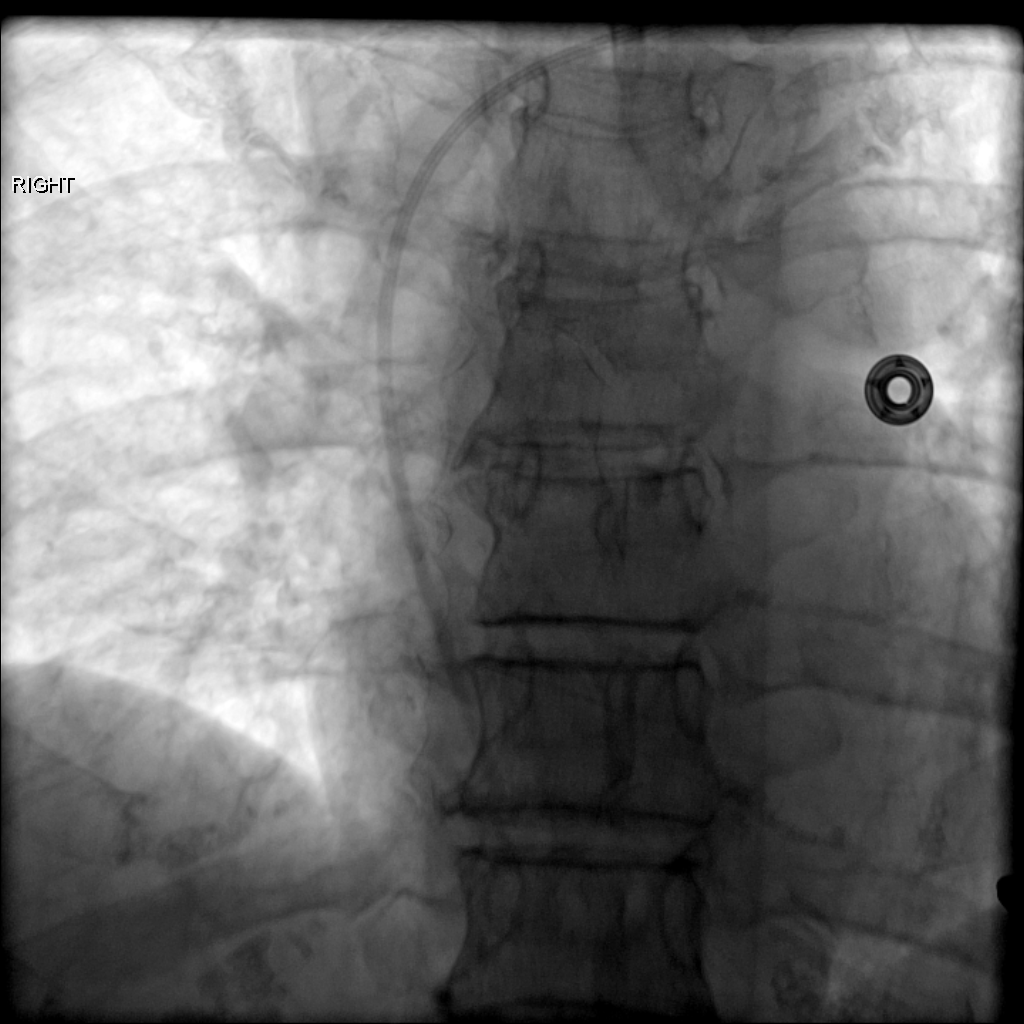

[1 of 1 positions shown; findings below may reference images not displayed]

IMAGES IMPORTED FROM THE [employer] WORKFLOW SYSTEM
NO DICTATION FOR STUDY

## 2014-10-20 IMAGING — CR DG ABDOMEN 2V
1 series · 4 of 4 positions shown · non-contrast
Comparison: CT ABD-PELV W/ CM dated 03/04/2013; CT ABD-PELV W/ CM
dated 02/13/2013

CLINICAL DATA: Reason sigmoid colectomy for acute diverticulitis.
Vomiting.

EXAM:
ABDOMEN - 2 VIEW

[Series 14: w abdomen upright · 0.14mm/px · 4 of 4 slices shown]
[im 1/4]
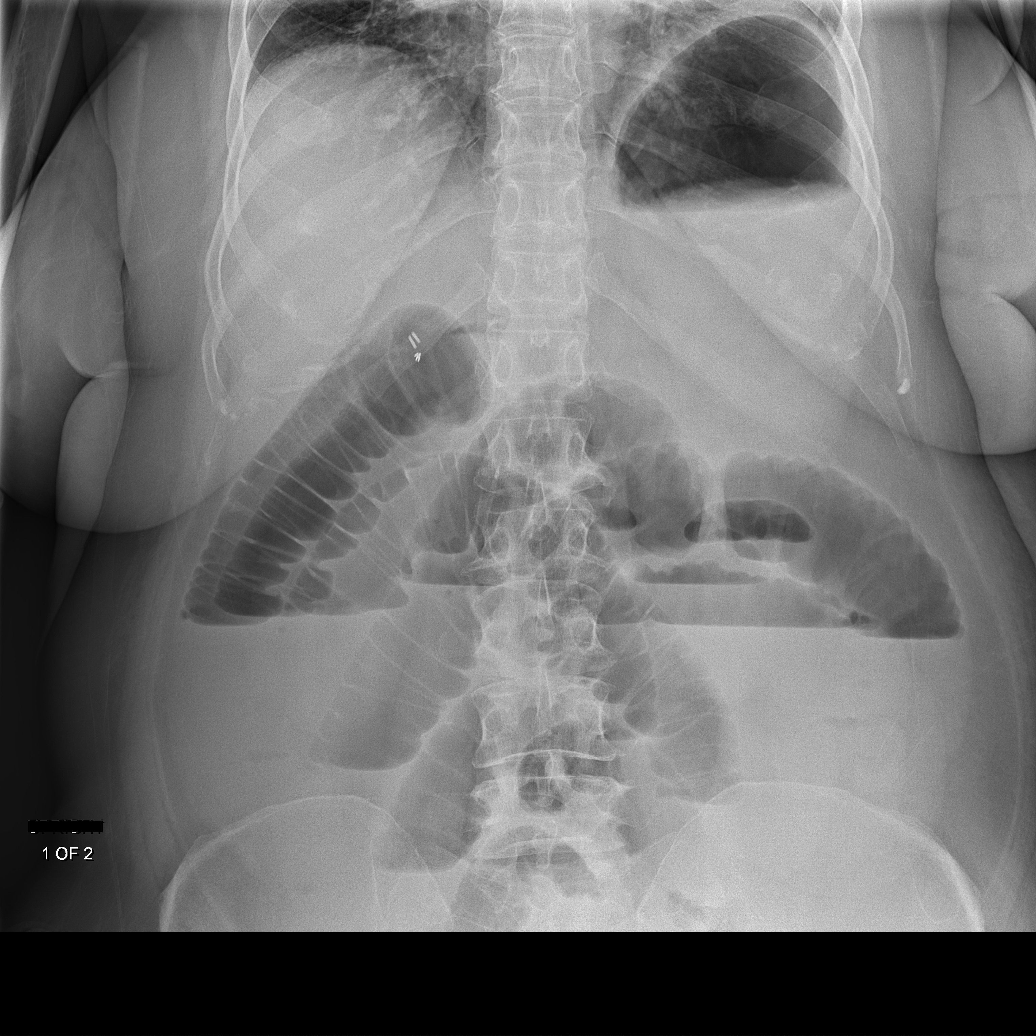
[im 2/4]
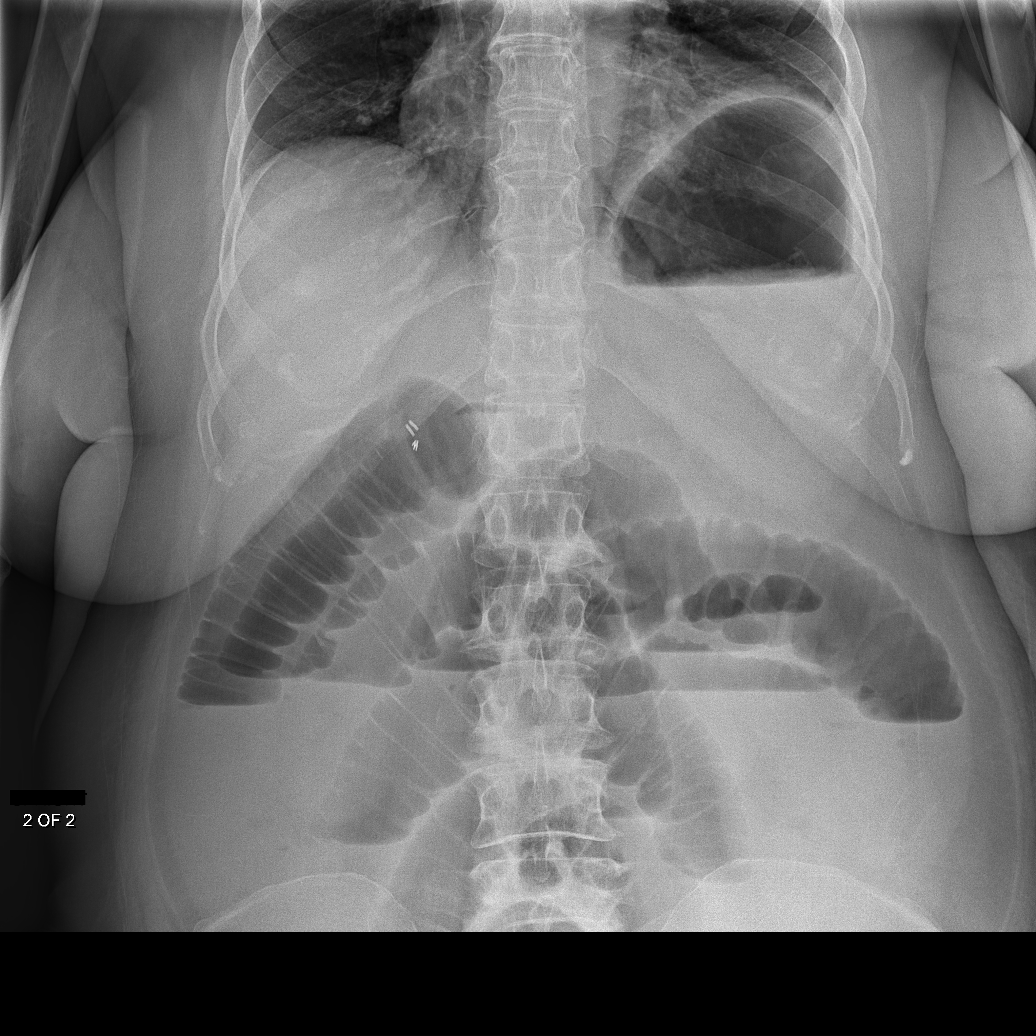
[im 3/4]
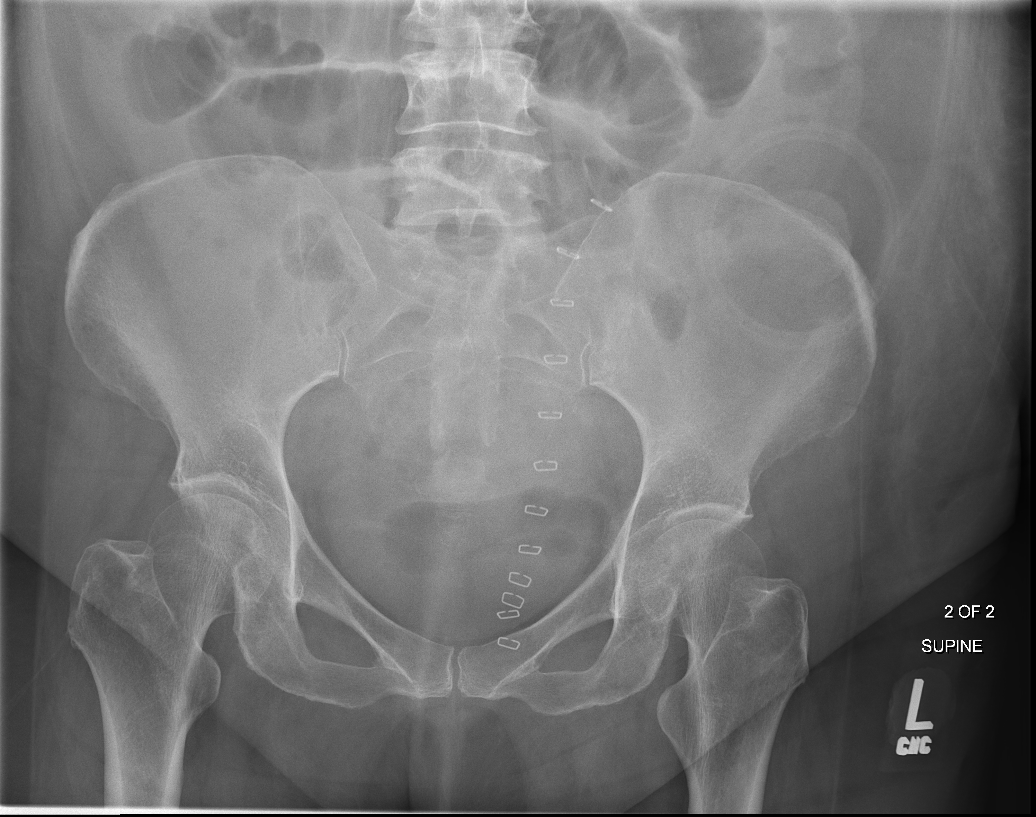
[im 4/4]
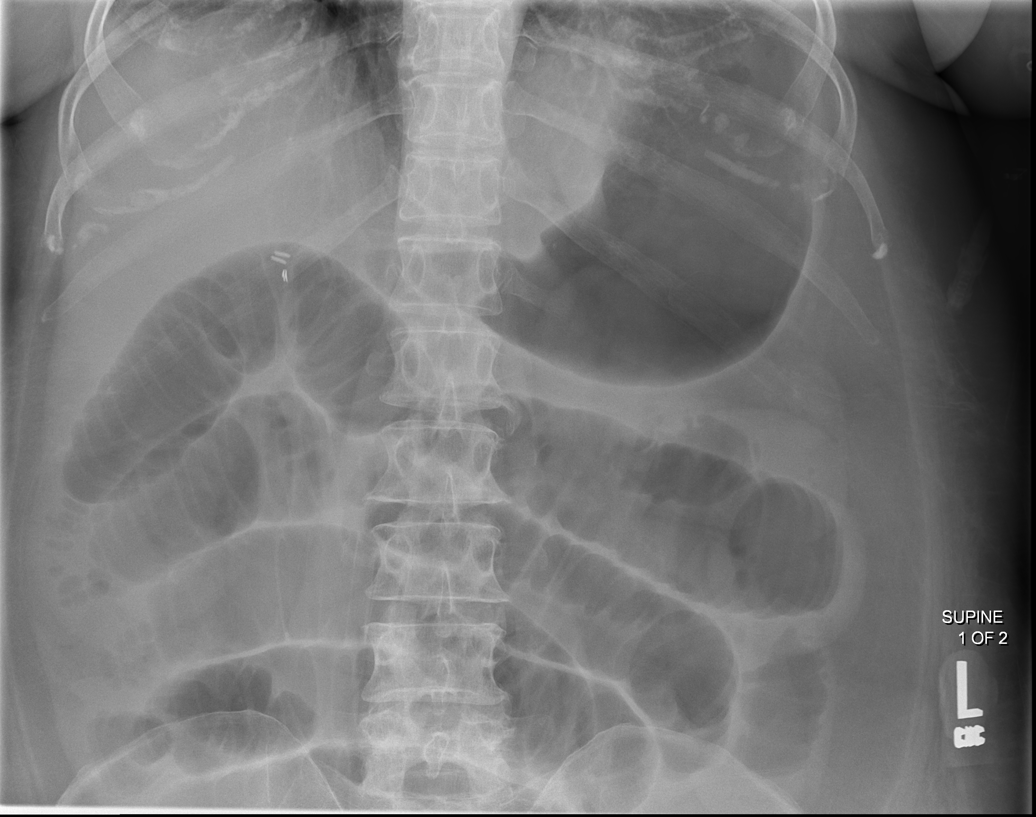

[4 of 4 positions shown; findings below may reference images not displayed]

FINDINGS: There is interval dilatation of multiple gas and fluid filled loops
of small bowel measuring up to 3.5 cm. These small bowel loops have
horizontal fluid levels. There is a left lower quadrant colostomy
noted. Surface skin staples are noted. No intraperitoneal free air.
IMPRESSION: Dilated loops of small bowel with horizontal (adynamic) fluid levels
suggest ileus over mechanical obstruction.

## 2014-11-08 IMAGING — CR DG ABDOMEN 2V
1 series · 3 of 3 positions shown · non-contrast
Comparison: Radiographs dated [DATE] and 03/22/2013

CLINICAL DATA: Vomiting.  Recent lysis of adhesions

EXAM:
ABDOMEN - 2 VIEW

[Series 22: w abdomen upright · 0.14mm/px · 3 of 3 slices shown]
[im 1/3]
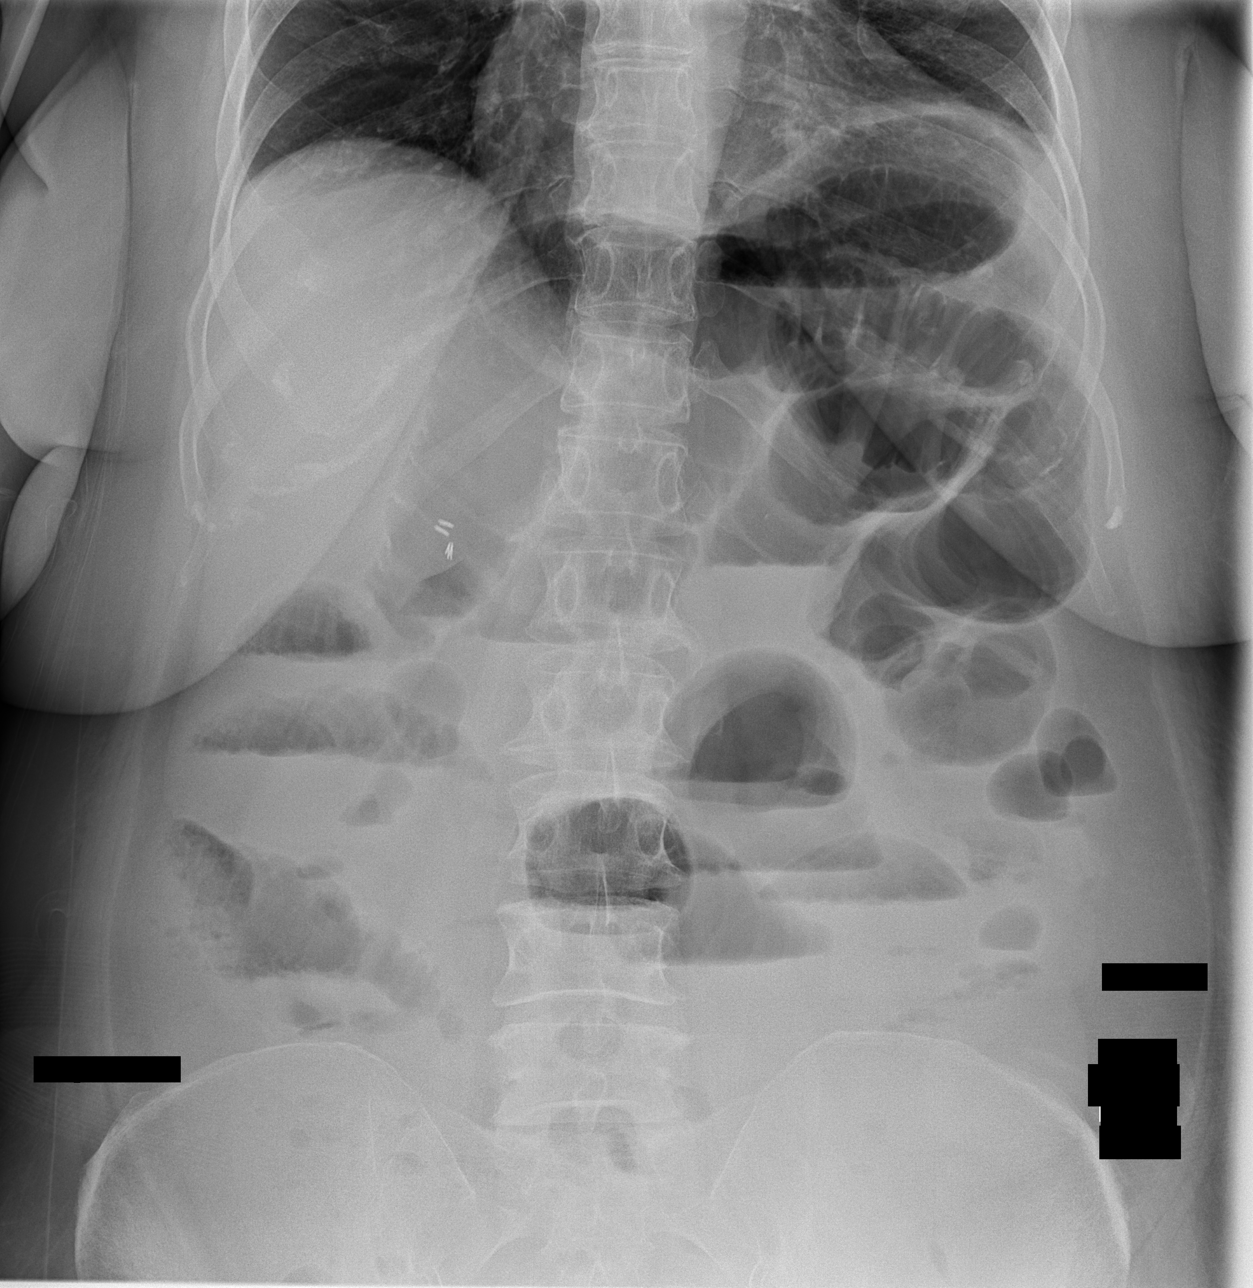
[im 2/3]
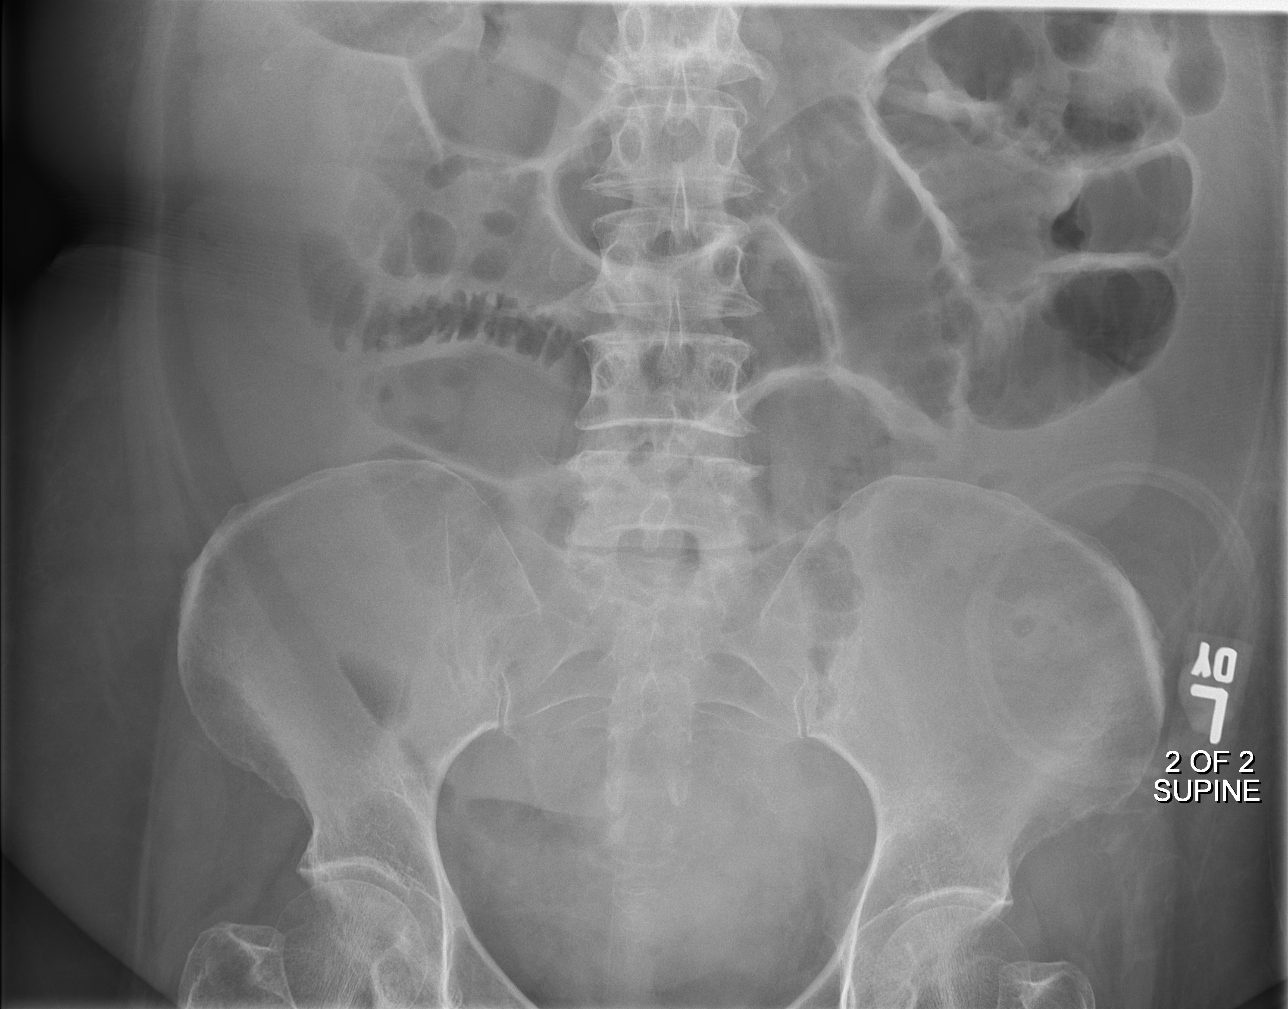
[im 3/3]
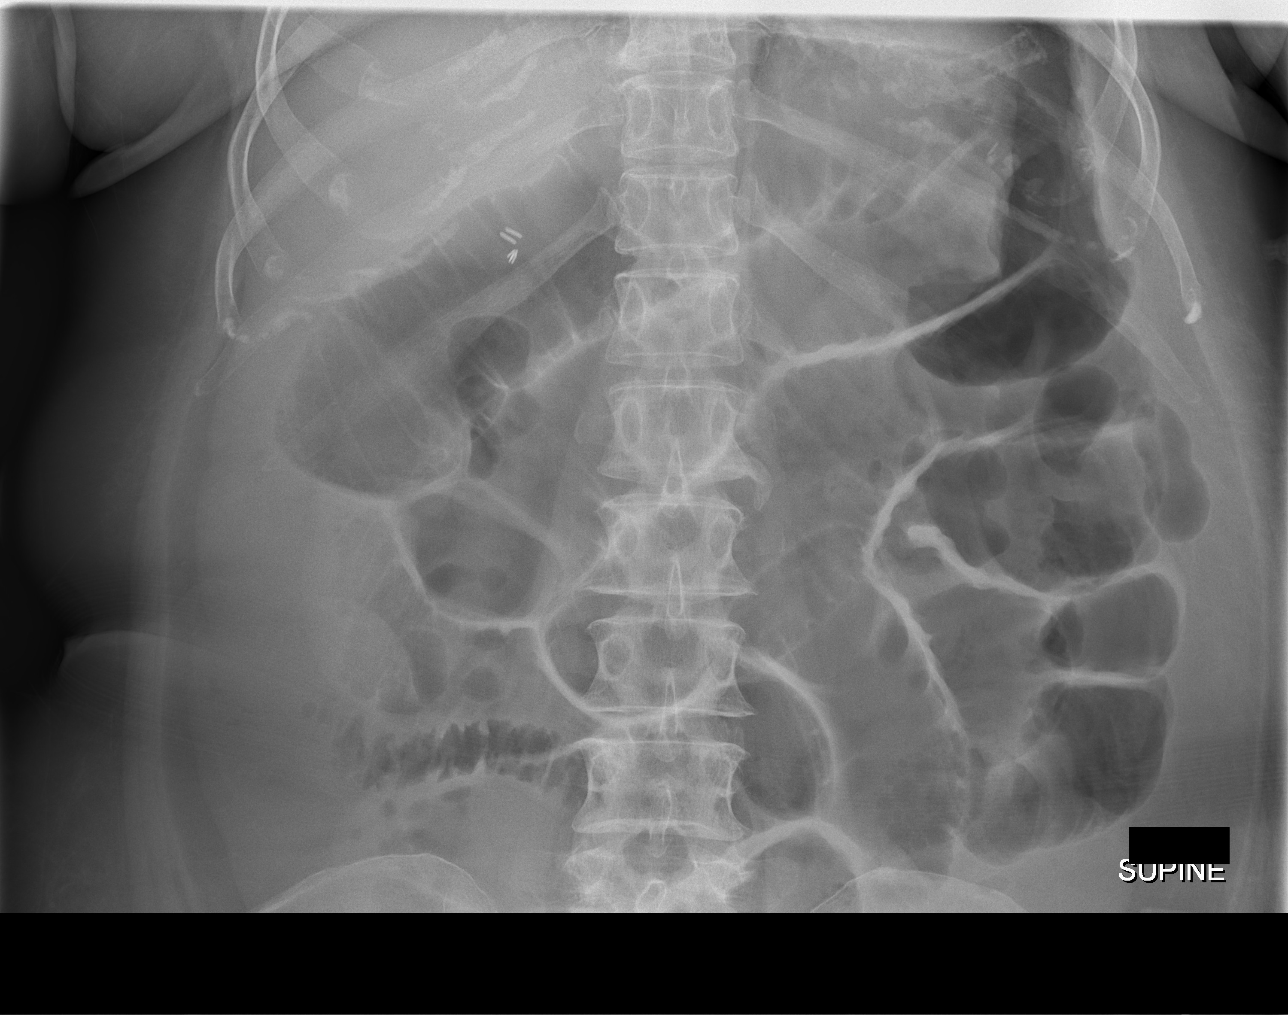

[3 of 3 positions shown; findings below may reference images not displayed]

FINDINGS: NG tube has been removed. There are multiple dilated loops of
proximal small bowel. Distal small bowel is not distended. The colon
is not distended.
IMPRESSION: Findings consistent with partial small bowel obstruction.

## 2014-11-10 IMAGING — CR DG ABDOMEN 2V
1 series · 4 of 4 positions shown · non-contrast
Comparison: 04/08/2013

CLINICAL DATA: Follow-up small bowel obstruction

EXAM:
ABDOMEN - 2 VIEW

[Series 26: x abdomen supine · 0.14mm/px · 4 of 4 slices shown]
[im 1/4]
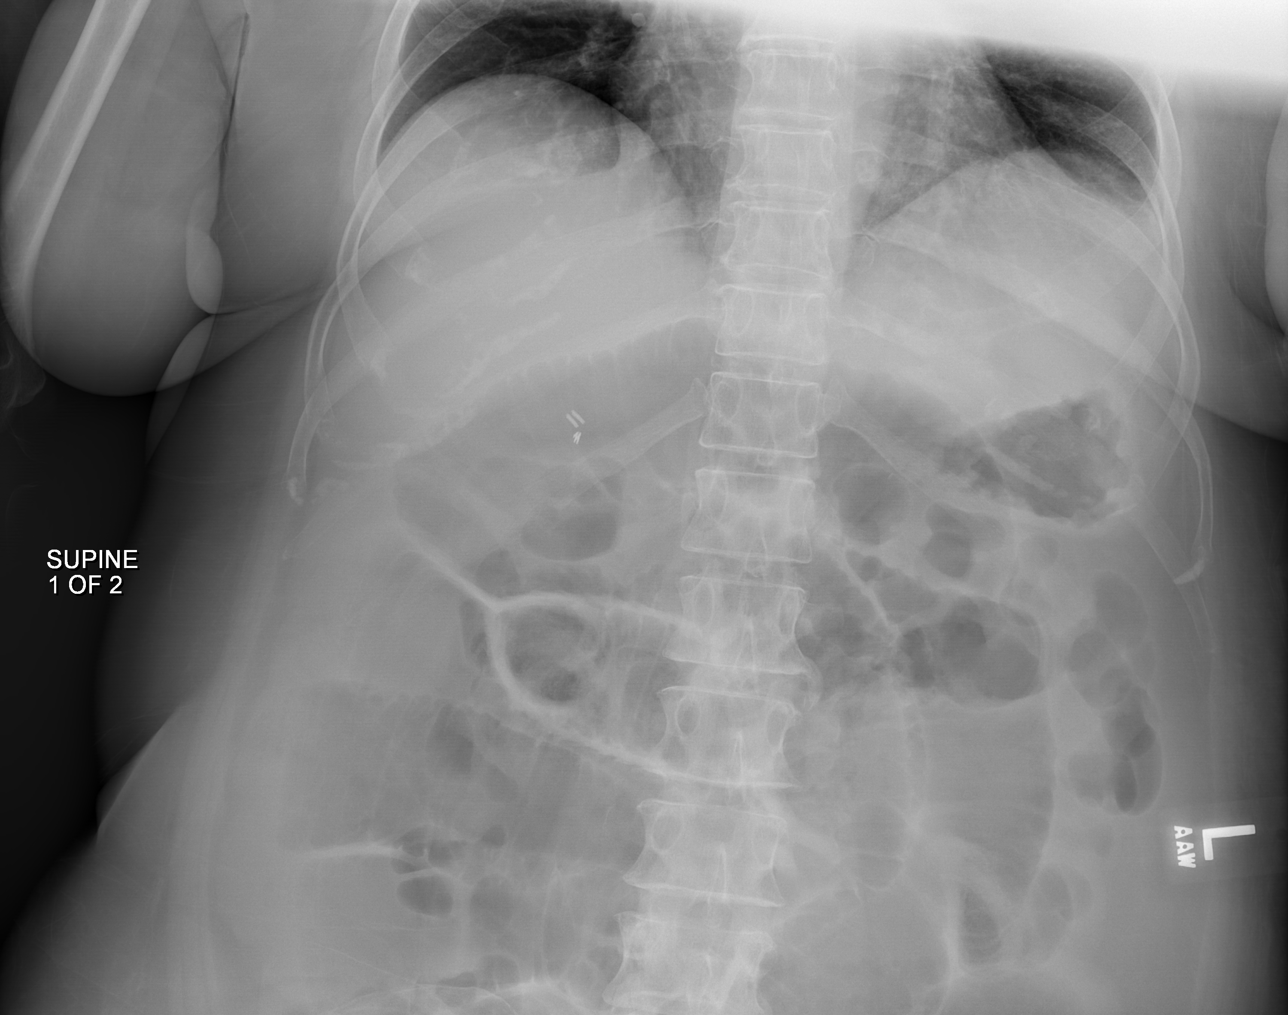
[im 2/4]
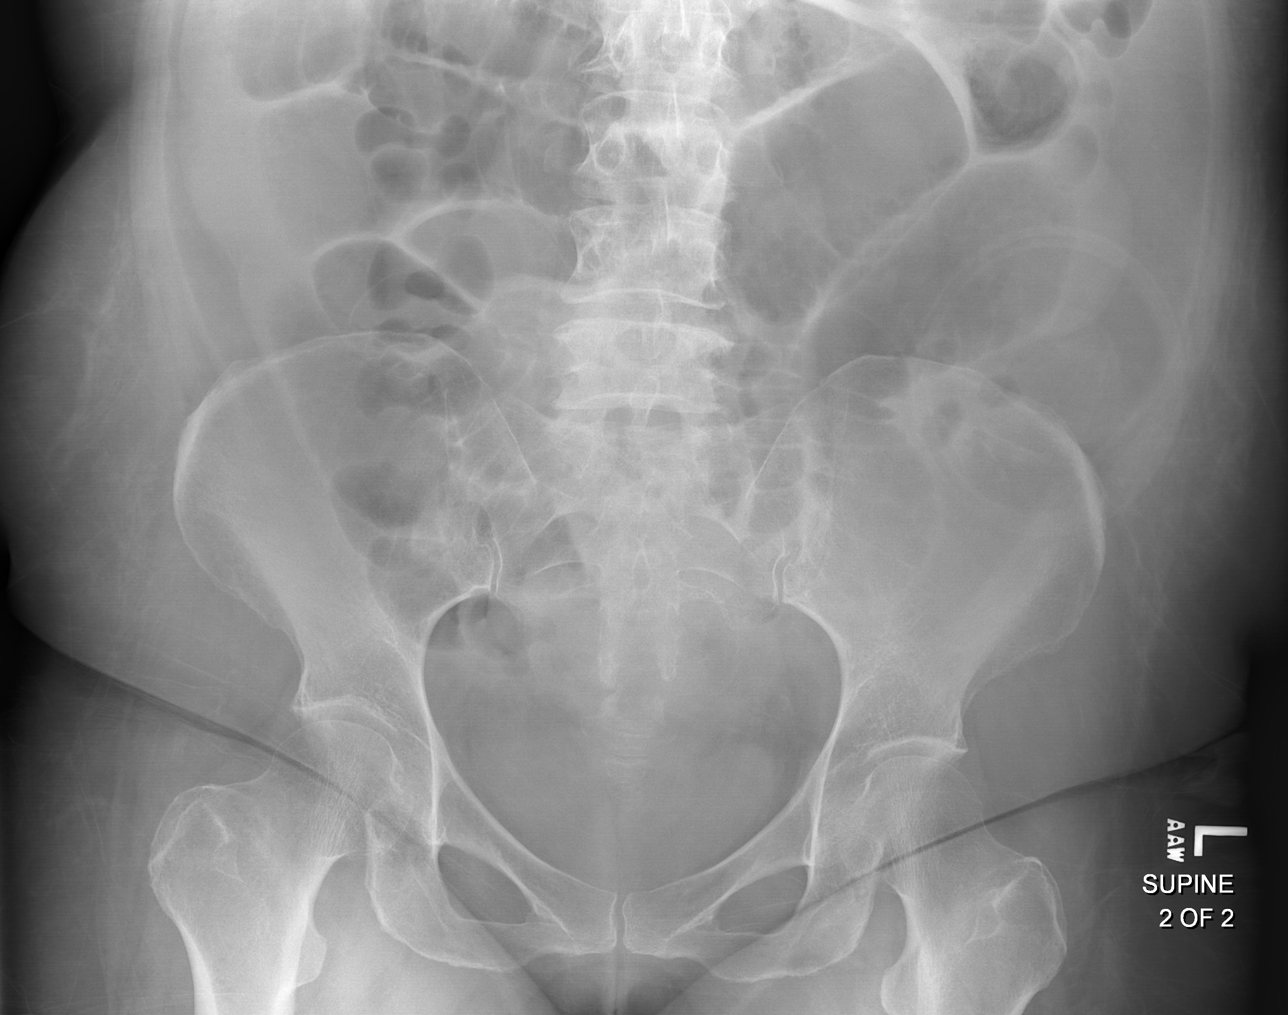
[im 3/4]
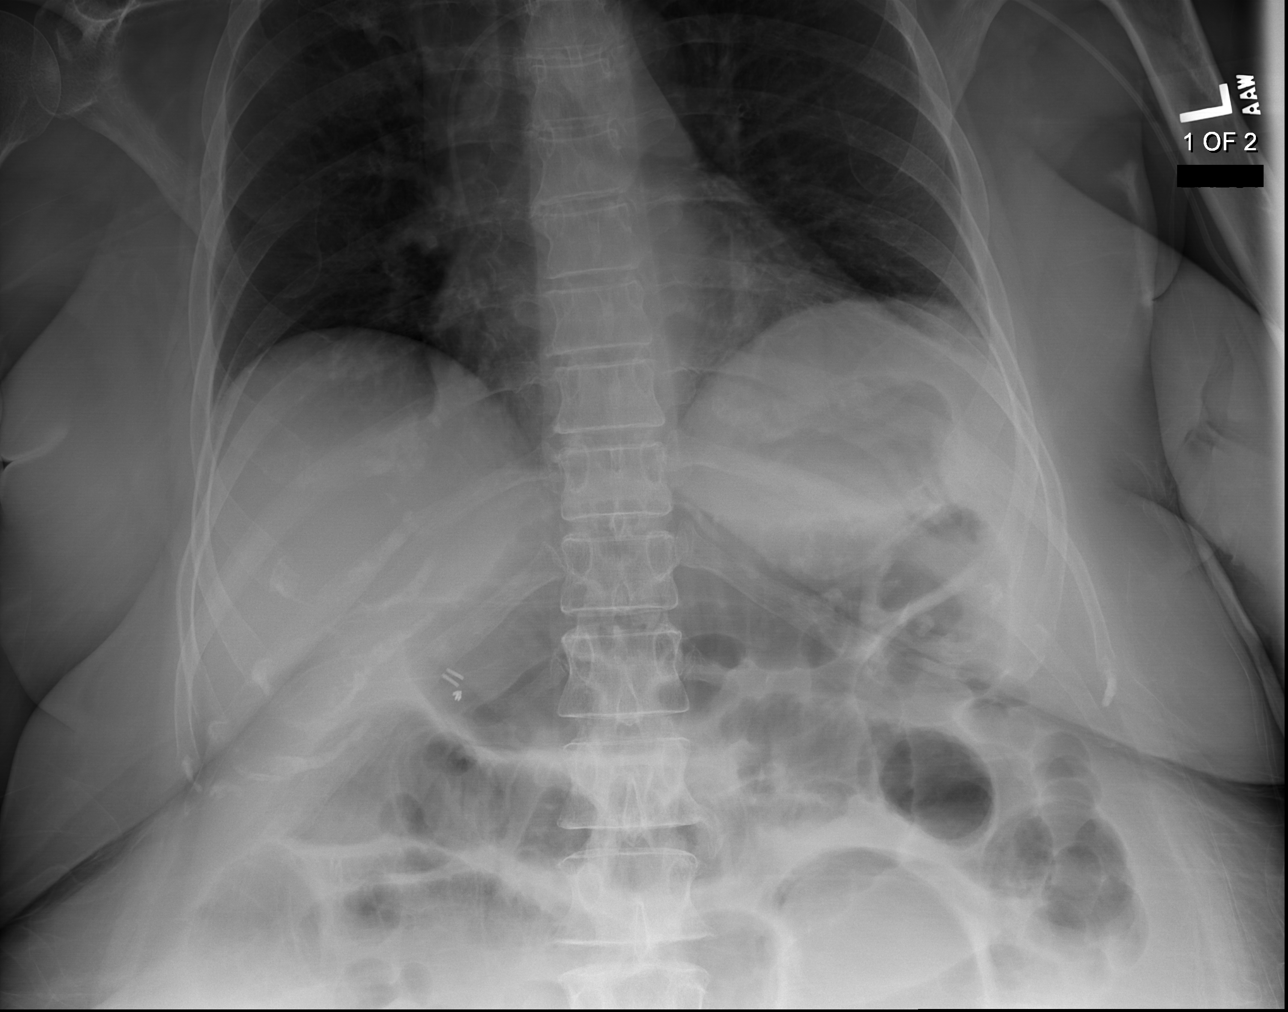
[im 4/4]
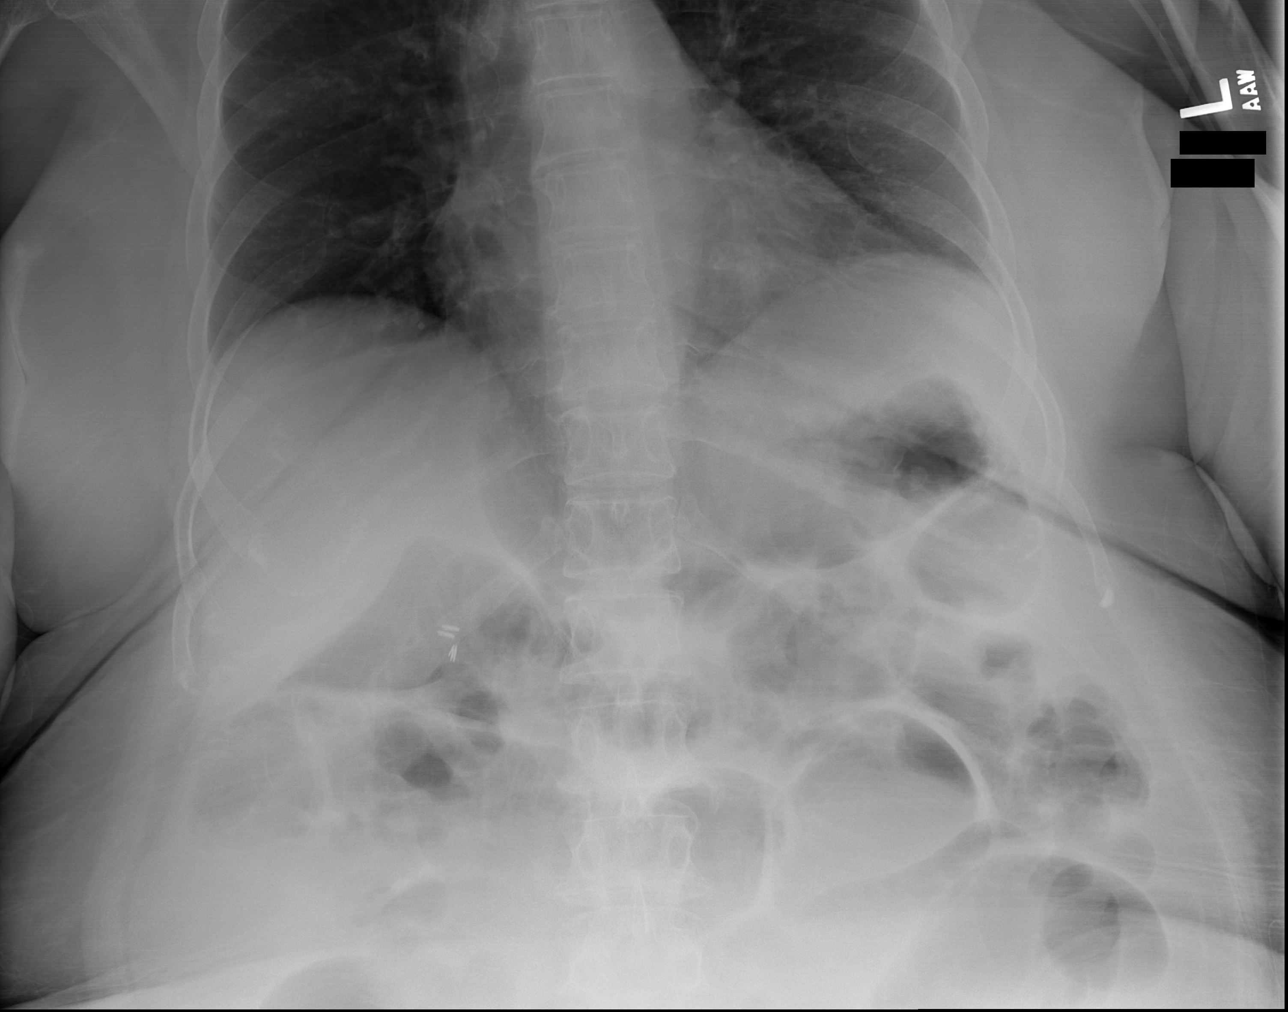

[4 of 4 positions shown; findings below may reference images not displayed]

FINDINGS: Persistent dilated loops of small bowel throughout abdomen up to
cm diameter.

Paucity of colonic gas.

No definite bowel wall thickening.

Bones diffusely demineralized.

Lung bases grossly clear.

Ostomy loaded left lower quadrant.

No urinary tract calcification or free intraperitoneal air.
IMPRESSION: Persistent small bowel obstruction.

## 2014-11-26 IMAGING — CR DG ABDOMEN 1V
1 series · 2 of 2 positions shown · non-contrast
Comparison: Supine and upright abdominal films from 10 April, 2013

CLINICAL DATA: Previous partial colectomy, now with nausea and
vomiting.

EXAM:
ABDOMEN - 1 VIEW

[Series 14: t abdomen supine · 0.14mm/px · 2 of 2 slices shown]
[im 1/2]
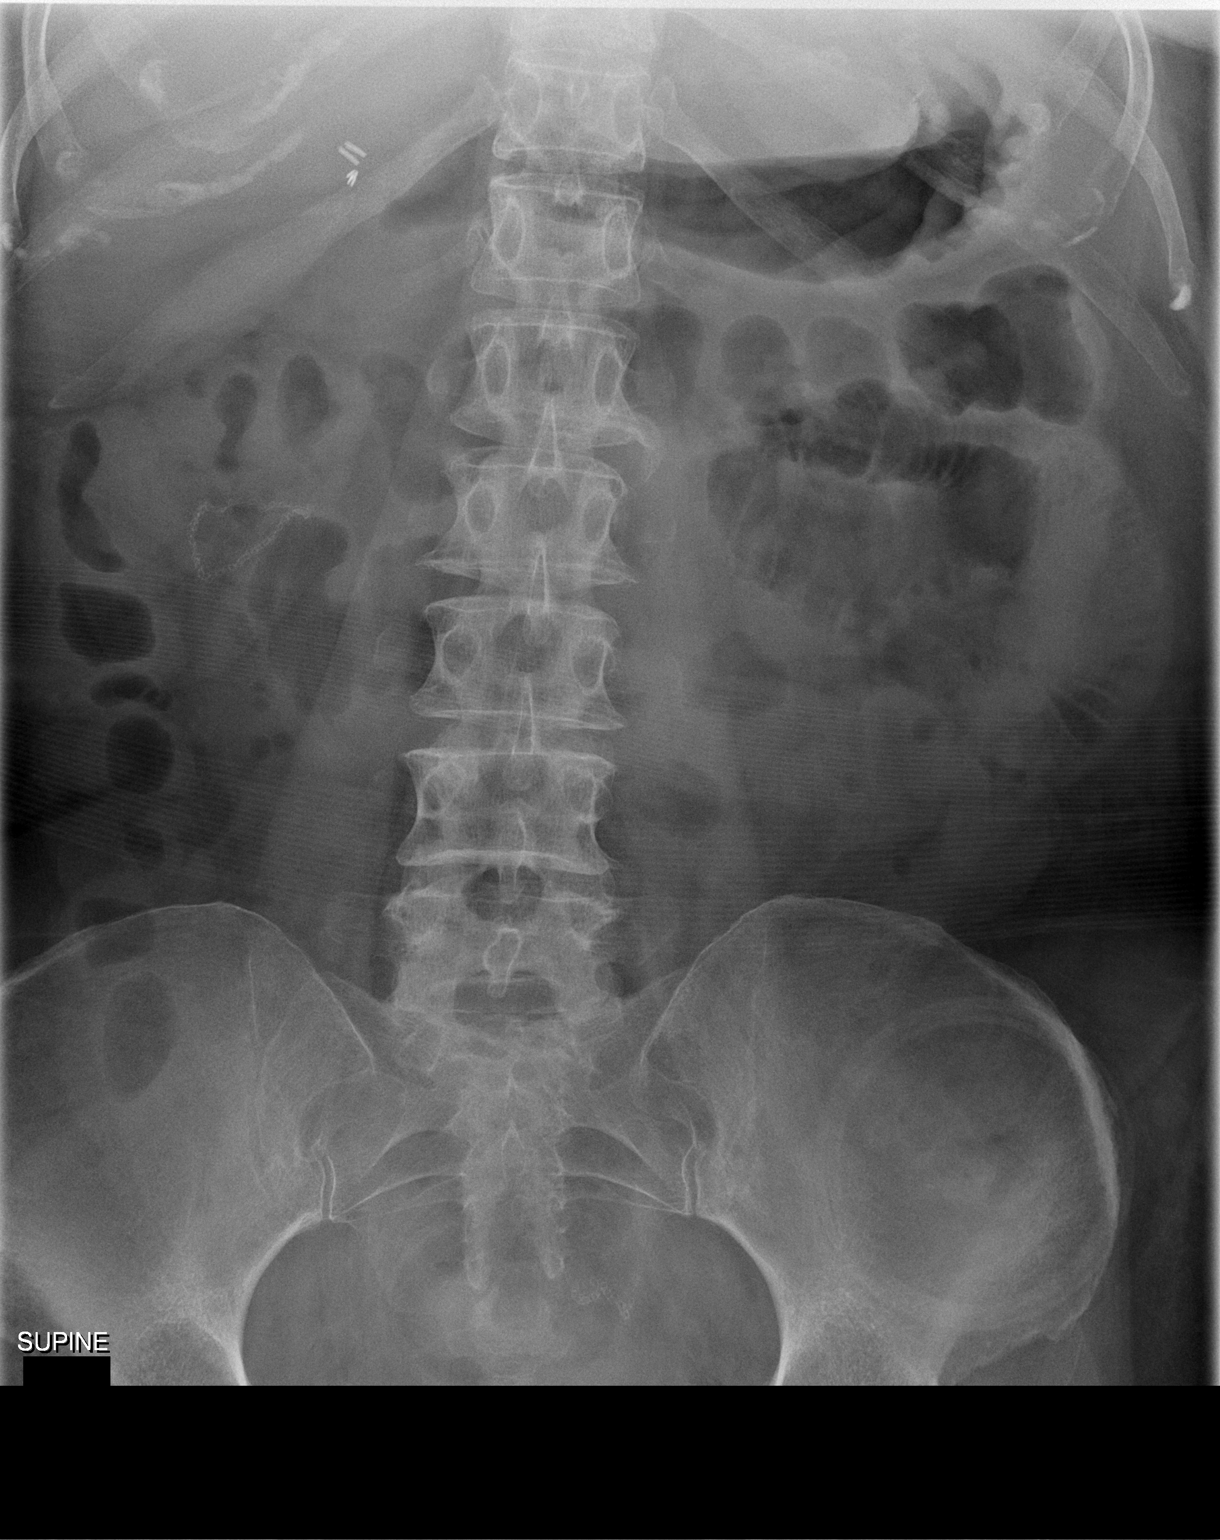
[im 2/2]
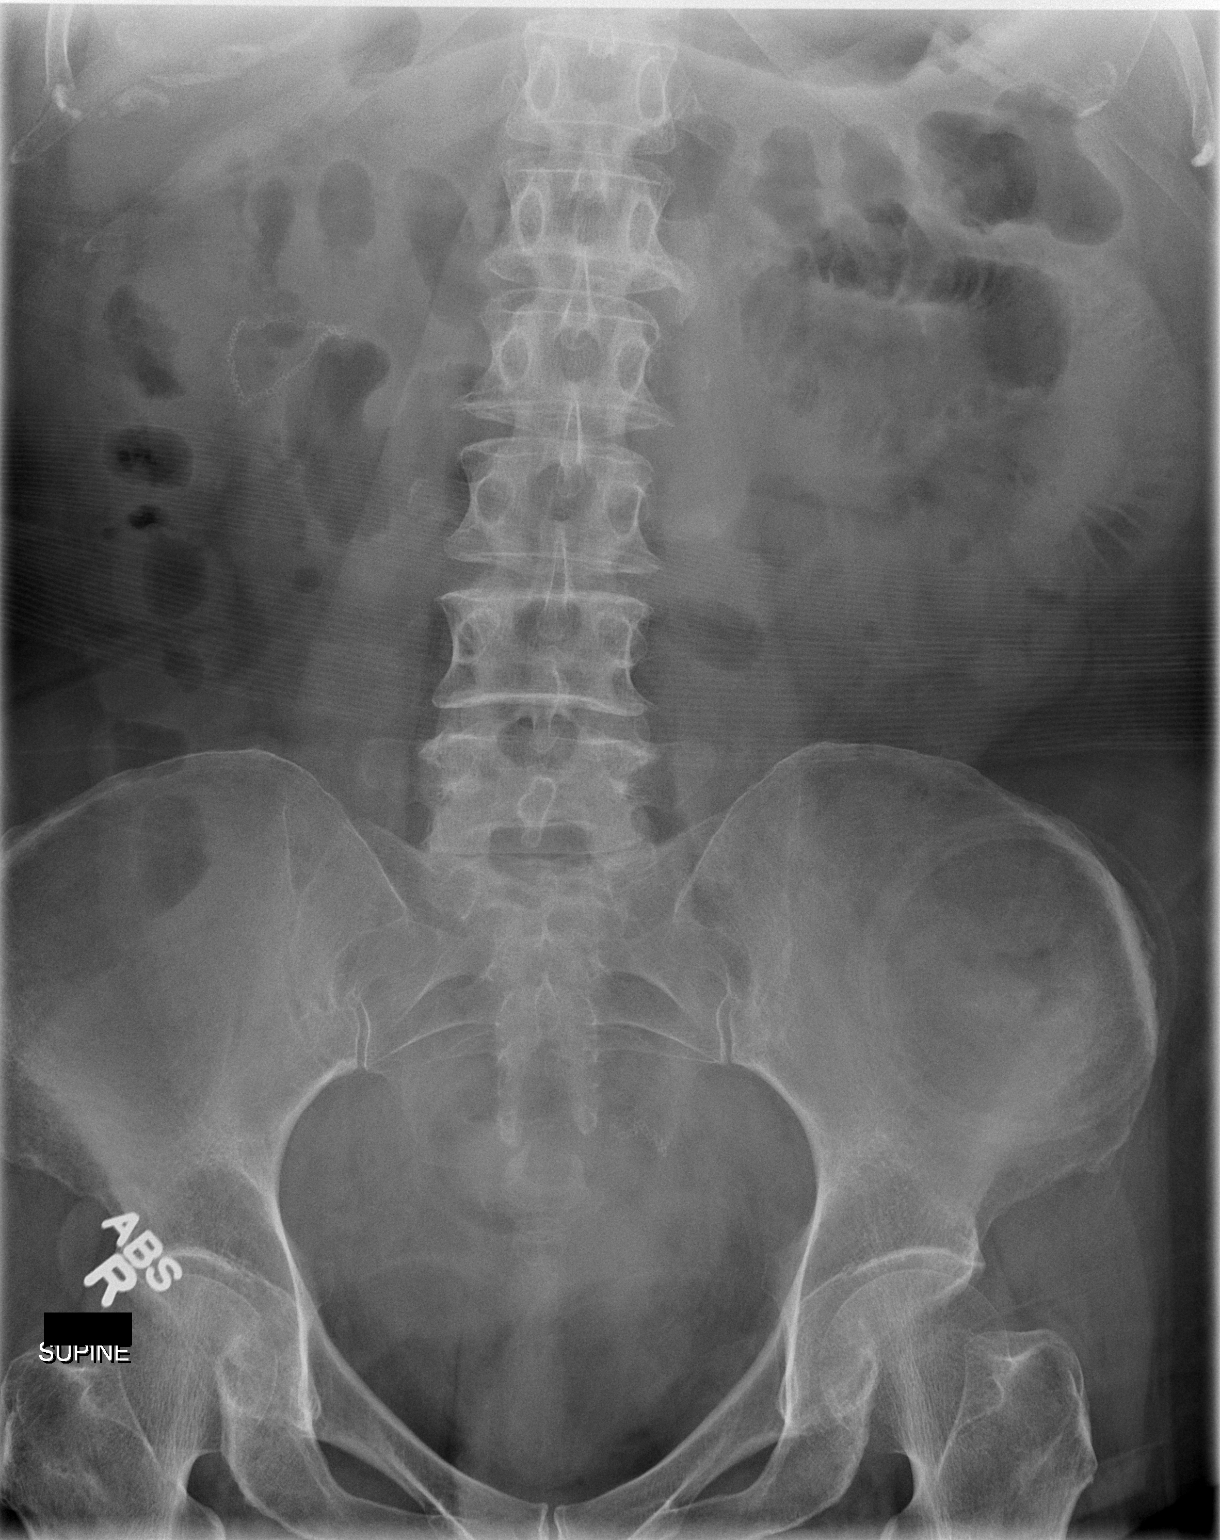

[2 of 2 positions shown; findings below may reference images not displayed]

FINDINGS: There is a small amount of gas within the stomach. There are loops
of minimally distended gas-filled small bowel in the left mid
abdomen. There is surgical suture material in the right mid abdomen.
For the patient's previous partial colectomy. The appearance of this
small and large bowel gas surrounding this region is normal. There
is a very small amount of gas and stool in the region of the rectum.
There is surgical suture material within the pelvis.
IMPRESSION: The bowel gas pattern is nonspecific. There is no evidence of
obstruction nor more than mild small bowel ileus. No free
extraluminal gas collections are demonstrated.

## 2015-01-03 ENCOUNTER — Other Ambulatory Visit: Payer: Self-pay | Admitting: Internal Medicine

## 2015-01-03 DIAGNOSIS — N63 Unspecified lump in unspecified breast: Secondary | ICD-10-CM

## 2015-02-05 ENCOUNTER — Ambulatory Visit
Admission: RE | Admit: 2015-02-05 | Discharge: 2015-02-05 | Disposition: A | Discharge status: Home / Self Care | Payer: Medicare Other | Source: Ambulatory Visit | Attending: Internal Medicine | Admitting: Internal Medicine

## 2015-02-05 DIAGNOSIS — N63 Unspecified lump in unspecified breast: Secondary | ICD-10-CM

## 2016-01-07 ENCOUNTER — Other Ambulatory Visit: Payer: Self-pay | Admitting: Internal Medicine

## 2016-01-08 ENCOUNTER — Other Ambulatory Visit: Payer: Self-pay | Admitting: Internal Medicine

## 2016-01-08 DIAGNOSIS — N63 Unspecified lump in unspecified breast: Secondary | ICD-10-CM

## 2016-02-11 ENCOUNTER — Ambulatory Visit
Admission: RE | Admit: 2016-02-11 | Discharge: 2016-02-11 | Disposition: A | Discharge status: Home / Self Care | Payer: Medicare Other | Source: Ambulatory Visit | Attending: Internal Medicine | Admitting: Internal Medicine

## 2016-02-11 DIAGNOSIS — R921 Mammographic calcification found on diagnostic imaging of breast: Secondary | ICD-10-CM | POA: Insufficient documentation

## 2016-02-11 DIAGNOSIS — N63 Unspecified lump in unspecified breast: Secondary | ICD-10-CM

## 2016-02-11 DIAGNOSIS — N6311 Unspecified lump in the right breast, upper outer quadrant: Secondary | ICD-10-CM | POA: Diagnosis present

## 2016-02-11 DIAGNOSIS — N6489 Other specified disorders of breast: Secondary | ICD-10-CM | POA: Insufficient documentation

## 2018-12-23 ENCOUNTER — Other Ambulatory Visit: Payer: Self-pay | Admitting: Internal Medicine

## 2018-12-23 ENCOUNTER — Ambulatory Visit
Admission: RE | Admit: 2018-12-23 | Discharge: 2018-12-23 | Disposition: A | Discharge status: Home / Self Care | Payer: Medicare Other | Source: Ambulatory Visit | Attending: Internal Medicine | Admitting: Internal Medicine

## 2018-12-23 ENCOUNTER — Other Ambulatory Visit: Payer: Self-pay

## 2018-12-23 ENCOUNTER — Encounter (INDEPENDENT_AMBULATORY_CARE_PROVIDER_SITE_OTHER): Payer: Self-pay

## 2018-12-23 DIAGNOSIS — A4150 Gram-negative sepsis, unspecified: Secondary | ICD-10-CM | POA: Insufficient documentation

## 2018-12-23 DIAGNOSIS — R1084 Generalized abdominal pain: Secondary | ICD-10-CM

## 2018-12-23 MED ORDER — IOHEXOL 300 MG/ML  SOLN
100.0000 mL | Freq: Once | INTRAMUSCULAR | Status: AC | PRN
  Administered 2018-12-23: 100 mL via INTRAVENOUS

## 2018-12-26 ENCOUNTER — Ambulatory Visit: Payer: Medicare Other

## 2019-04-12 ENCOUNTER — Other Ambulatory Visit: Payer: Self-pay | Admitting: Internal Medicine

## 2019-04-12 DIAGNOSIS — R921 Mammographic calcification found on diagnostic imaging of breast: Secondary | ICD-10-CM

## 2022-10-27 ENCOUNTER — Other Ambulatory Visit: Payer: Self-pay | Admitting: Internal Medicine

## 2022-10-27 DIAGNOSIS — Z8249 Family history of ischemic heart disease and other diseases of the circulatory system: Secondary | ICD-10-CM

## 2022-10-27 DIAGNOSIS — G5791 Unspecified mononeuropathy of right lower limb: Secondary | ICD-10-CM

## 2022-11-04 ENCOUNTER — Ambulatory Visit
Admission: RE | Admit: 2022-11-04 | Discharge: 2022-11-04 | Disposition: A | Discharge status: Home / Self Care | Payer: Medicare PPO | Source: Ambulatory Visit | Attending: Internal Medicine | Admitting: Internal Medicine

## 2022-11-04 DIAGNOSIS — Z8249 Family history of ischemic heart disease and other diseases of the circulatory system: Secondary | ICD-10-CM | POA: Insufficient documentation

## 2022-11-04 DIAGNOSIS — G5791 Unspecified mononeuropathy of right lower limb: Secondary | ICD-10-CM | POA: Insufficient documentation
